# Patient Record
Sex: Male | Born: 1950 | Race: White | Hispanic: Refuse to answer | Marital: Married | Smoking: Never smoker
Health system: Southern US, Community
[De-identification: ages and names within clinical notes are randomized; demographics above are authoritative.]

## PROBLEM LIST (undated history)

## (undated) DIAGNOSIS — K769 Liver disease, unspecified: Secondary | ICD-10-CM

## (undated) DIAGNOSIS — D696 Thrombocytopenia, unspecified: Secondary | ICD-10-CM

## (undated) DIAGNOSIS — F419 Anxiety disorder, unspecified: Secondary | ICD-10-CM

---

## 2002-09-10 ENCOUNTER — Encounter: Payer: Self-pay | Admitting: Unknown Physician Specialty

## 2002-09-10 ENCOUNTER — Ambulatory Visit (HOSPITAL_COMMUNITY): Admission: RE | Admit: 2002-09-10 | Discharge: 2002-09-10 | Payer: Self-pay | Admitting: Unknown Physician Specialty

## 2009-01-03 ENCOUNTER — Ambulatory Visit: Payer: Self-pay | Admitting: Cardiology

## 2011-04-20 NOTE — H&P (Signed)
NTS SOAP Note  Vital Signs:  Vitals as of: 04/19/2011: Systolic 140: Diastolic 80: Heart Rate 60: Temp 97.67F: Height 26ft 1in: Weight 181Lbs 0 Ounces: Pain Level 0: BMI 24  BMI : 23.88 kg/m2  Subjective: This 61 Years 2 Months old Male presents for of  HERNIA: ,Has been having right groin pain for the past month.  Recently noted a lump in that region.  Pain radiates to the right testicle.  Made worse with straining.  Review of Symptoms:  Constitutional:unremarkable Head:unremarkable Eyes:unremarkable Nose/Mouth/Throat:unremarkable Cardiovascular:unremarkable Respiratory:unremarkable Gastrointestinal:unremarkable Genitourinary:unremarkable Musculoskeletal:unremarkable Skin:unremarkable Hematolgic/Lymphatic:unremarkable Allergic/Immunologic:unremarkable   Past Medical History:Reviewed   Past Medical History  Surgical History: none Medical Problems: none Allergies: nkda Medications: flonase, citalapram   Social History:Reviewed   Social History  Preferred Language: English ([place]) Race:  White Ethnicity: Not Hispanic / Latino Age: 61 Years 7 Months Alcohol:  Yes, one drink daily Recreational drug(s):  No   Smoking Status: Never smoker reviewed on 04/19/2011  Family History:Reviewed   Family History              Father:  Cancer    Objective Information: General:Well appearing, well nourished in no distress. Head:Atraumatic; no masses; no abnormalities Neck:Supple without lymphadenopathy.  Heart:RRR, no murmur or gallop.  Normal S1, S2.  No S3, S4.  Lungs:CTA bilaterally, no wheezes, rhonchi, rales.  Breathing unlabored. Abdomen:Soft, NT/ND, normal bowel sounds, no HSM, no masses.  No peritoneal signs.  Reducible right inguinal hernia noted. WG:[id number]  Assessment:Right inguinal hernia  Diagnosis &amp; Procedure: DiagnosisCode: 550.90, ProcedureCode: 46962,     Plan:  Will call to schedule right inguinal herniorrhaphy.   Patient Education:Alternative treatments to surgery were discussed with patient (and family).Risks and benefits  of procedure were fully explained to the patient (and family) who gave informed consent. Patient/family questions were addressed.  Follow-up:Pending Surgery

## 2011-04-26 ENCOUNTER — Encounter (HOSPITAL_COMMUNITY): Payer: Self-pay | Admitting: Pharmacy Technician

## 2011-05-03 ENCOUNTER — Other Ambulatory Visit: Payer: Self-pay

## 2011-05-03 ENCOUNTER — Encounter (HOSPITAL_COMMUNITY): Payer: Self-pay

## 2011-05-03 ENCOUNTER — Encounter (HOSPITAL_COMMUNITY)
Admission: RE | Admit: 2011-05-03 | Discharge: 2011-05-03 | Disposition: A | Discharge status: Home / Self Care | Payer: 59 | Source: Ambulatory Visit | Attending: General Surgery | Admitting: General Surgery

## 2011-05-03 HISTORY — DX: Gilbert syndrome: E80.4

## 2011-05-03 HISTORY — DX: Liver disease, unspecified: K76.9

## 2011-05-03 HISTORY — DX: Anxiety disorder, unspecified: F41.9

## 2011-05-03 HISTORY — DX: Thrombocytopenia, unspecified: D69.6

## 2011-05-03 LAB — SURGICAL PCR SCREEN: Staphylococcus aureus: POSITIVE — AB

## 2011-05-03 LAB — CBC
HCT: 47.5 % (ref 39.0–52.0)
MCV: 95.2 fL (ref 78.0–100.0)
Platelets: 135 10*3/uL — ABNORMAL LOW (ref 150–400)
RBC: 4.99 MIL/uL (ref 4.22–5.81)
WBC: 6.4 10*3/uL (ref 4.0–10.5)

## 2011-05-03 LAB — BASIC METABOLIC PANEL
CO2: 31 mEq/L (ref 19–32)
Calcium: 10.2 mg/dL (ref 8.4–10.5)
Chloride: 102 mEq/L (ref 96–112)
Sodium: 139 mEq/L (ref 135–145)

## 2011-05-03 LAB — DIFFERENTIAL
Eosinophils Relative: 4 % (ref 0–5)
Lymphocytes Relative: 32 % (ref 12–46)
Lymphs Abs: 2.1 10*3/uL (ref 0.7–4.0)
Neutro Abs: 3.6 10*3/uL (ref 1.7–7.7)

## 2011-05-03 NOTE — Patient Instructions (Addendum)
22 Keith Bailey  05/03/2011   Your procedure is scheduled on:  05/09/11  Report to [facility] at 06:15 AM.  Call this number if you have problems the morning of surgery: [phone]   Remember:   Do not eat food:After Midnight.  May have clear liquids:until Midnight .  Clear liquids include soda, tea, black coffee, apple or grape juice, broth.  Take these medicines the morning of surgery with A SIP OF WATER: Celexa. Also, use your inhalers Flonase and Astepro if needed and bring them with you to the hospital.   Do not wear jewelry, make-up or nail polish.  Do not wear lotions, powders, or perfumes. You may wear deodorant.  Do not shave 48 hours prior to surgery.  Do not bring valuables to the hospital.  Contacts, dentures or bridgework may not be worn into surgery.  Leave suitcase in the car. After surgery it may be brought to your room.  For patients admitted to the hospital, checkout time is 11:00 AM the day of discharge.   Patients discharged the day of surgery will not be allowed to drive home.  Name and phone number of your driver:   Special Instructions: CHG Shower Use Special Wash: 1/2 bottle night before surgery and 1/2 bottle morning of surgery.   Please read over the following fact sheets that you were given: Pain Booklet, MRSA Information, Surgical Site Infection Prevention, Anesthesia Post-op Instructions and Care and Recovery After Surgery    Inguinal Hernia, Adult Muscles help keep everything in the body in its proper place. But if a weak spot in the muscles develops, something can poke through. That is called a hernia. When this happens in the lower part of the belly (abdomen), it is called an inguinal hernia. (It takes its name from a part of the body in this region called the inguinal canal.) A weak spot in the wall of muscles lets some fat or part of the small intestine bulge through. An inguinal hernia can develop at any age. Men get them more often than women. CAUSES    In adults, an inguinal hernia develops over time.  It can be triggered by:   Suddenly straining the muscles of the lower abdomen.   Lifting heavy objects.   Straining to have a bowel movement. Difficult bowel movements (constipation) can lead to this.   Constant coughing. This may be caused by smoking or lung disease.   Being overweight.   Being pregnant.   Working at a job that requires long periods of standing or heavy lifting.   Having had an inguinal hernia before.  One type can be an emergency situation. It is called a strangulated inguinal hernia. It develops if part of the small intestine slips through the weak spot and cannot get back into the abdomen. The blood supply can be cut off. If that happens, part of the intestine may die. This situation requires emergency surgery. SYMPTOMS  Often, a small inguinal hernia has no symptoms. It is found when a healthcare provider does a physical exam. Larger hernias usually have symptoms.   In adults, symptoms may include:   A lump in the groin. This is easier to see when the person is standing. It might disappear when lying down.   In men, a lump in the scrotum.   Pain or burning in the groin. This occurs especially when lifting, straining or coughing.   A dull ache or feeling of pressure in the groin.   Signs of  a strangulated hernia can include:   A bulge in the groin that becomes very painful and tender to the touch.   A bulge that turns red or purple.   Fever, nausea and vomiting.   Inability to have a bowel movement or to pass gas.  DIAGNOSIS  To decide if you have an inguinal hernia, a healthcare provider will probably do a physical examination.  This will include asking questions about any symptoms you have noticed.   The healthcare provider might feel the groin area and ask you to cough. If an inguinal hernia is felt, the healthcare provider may try to slide it back into the abdomen.   Usually no other tests  are needed.  TREATMENT  Treatments can vary. The size of the hernia makes a difference. Options include:  Watchful waiting. This is often suggested if the hernia is small and you have had no symptoms.   No medical procedure will be done unless symptoms develop.   You will need to watch closely for symptoms. If any occur, contact your healthcare provider right away.   Surgery. This is used if the hernia is larger or you have symptoms.   Open surgery. This is usually an outpatient procedure (you will not stay overnight in a hospital). An cut (incision) is made through the skin in the groin. The hernia is put back inside the abdomen. The weak area in the muscles is then repaired by herniorrhaphy or hernioplasty. Herniorrhaphy: in this type of surgery, the weak muscles are sewn back together. Hernioplasty: a patch or mesh is used to close the weak area in the abdominal wall.   Laparoscopy. In this procedure, a surgeon makes small incisions. A thin tube with a tiny video camera (called a laparoscope) is put into the abdomen. The surgeon repairs the hernia with mesh by looking with the video camera and using two long instruments.  HOME CARE INSTRUCTIONS   After surgery to repair an inguinal hernia:   You will need to take pain medicine prescribed by your healthcare provider. Follow all directions carefully.   You will need to take care of the wound from the incision.   Your activity will be restricted for awhile. This will probably include no heavy lifting for several weeks. You also should not do anything too active for a few weeks. When you can return to work will depend on the type of job that you have.   During "watchful waiting" periods, you should:   Maintain a healthy weight.   Eat a diet high in fiber (fruits, vegetables and whole grains).   Drink plenty of fluids to avoid constipation. This means drinking enough water and other liquids to keep your urine clear or pale yellow.   Do  not lift heavy objects.   Do not stand for long periods of time.   Quit smoking. This should keep you from developing a frequent cough.  SEEK MEDICAL CARE IF:   A bulge develops in your groin area.   You feel pain, a burning sensation or pressure in the groin. This might be worse if you are lifting or straining.   You develop a fever of more than 100.5 F (38.1 C).  SEEK IMMEDIATE MEDICAL CARE IF:   Pain in the groin increases suddenly.   A bulge in the groin gets bigger suddenly and does not go down.   For men, there is sudden pain in the scrotum. Or, the size of the scrotum increases.   A  bulge in the groin area becomes red or purple and is painful to touch.   You have nausea or vomiting that does not go away.   You feel your heart beating much faster than normal.   You cannot have a bowel movement or pass gas.   You develop a fever of more than 102.0 F (38.9 C).  Document Released: 08/12/2008 Document Revised: 12/06/2010 Document Reviewed: 08/12/2008 [facility] Patient Information 2012 [place], [place].   PATIENT INSTRUCTIONS POST-ANESTHESIA  IMMEDIATELY FOLLOWING SURGERY:  Do not drive or operate machinery for the first twenty four hours after surgery.  Do not make any important decisions for twenty four hours after surgery or while taking narcotic pain medications or sedatives.  If you develop intractable nausea and vomiting or a severe headache please notify your doctor immediately.  FOLLOW-UP:  Please make an appointment with your surgeon as instructed. You do not need to follow up with anesthesia unless specifically instructed to do so.  WOUND CARE INSTRUCTIONS (if applicable):  Keep a dry clean dressing on the anesthesia/puncture wound site if there is drainage.  Once the wound has quit draining you may leave it open to air.  Generally you should leave the bandage intact for twenty four hours unless there is drainage.  If the epidural site drains for more than 36-48  hours please call the anesthesia department.  QUESTIONS?:  Please feel free to call your physician or the hospital operator if you have any questions, and they will be happy to assist you.     [facility] Anesthesia Department [address] [place] [phone]

## 2011-05-09 ENCOUNTER — Ambulatory Visit (HOSPITAL_COMMUNITY): Payer: 59 | Admitting: Anesthesiology

## 2011-05-09 ENCOUNTER — Encounter (HOSPITAL_COMMUNITY)
Admission: RE | Disposition: A | Discharge status: Home / Self Care | Payer: Self-pay | Source: Ambulatory Visit | Attending: General Surgery

## 2011-05-09 ENCOUNTER — Encounter (HOSPITAL_COMMUNITY): Payer: Self-pay | Admitting: Anesthesiology

## 2011-05-09 ENCOUNTER — Encounter (HOSPITAL_COMMUNITY): Payer: Self-pay | Admitting: *Deleted

## 2011-05-09 ENCOUNTER — Ambulatory Visit (HOSPITAL_COMMUNITY)
Admission: RE | Admit: 2011-05-09 | Discharge: 2011-05-09 | Disposition: A | Discharge status: Home / Self Care | Payer: 59 | Source: Ambulatory Visit | Attending: General Surgery | Admitting: General Surgery

## 2011-05-09 DIAGNOSIS — K4090 Unilateral inguinal hernia, without obstruction or gangrene, not specified as recurrent: Secondary | ICD-10-CM | POA: Insufficient documentation

## 2011-05-09 DIAGNOSIS — Z01812 Encounter for preprocedural laboratory examination: Secondary | ICD-10-CM | POA: Insufficient documentation

## 2011-05-09 DIAGNOSIS — Z01810 Encounter for preprocedural cardiovascular examination: Secondary | ICD-10-CM | POA: Insufficient documentation

## 2011-05-09 HISTORY — PX: INGUINAL HERNIA REPAIR: SHX194

## 2011-05-09 SURGERY — REPAIR, HERNIA, INGUINAL, ADULT
Anesthesia: General | Site: Groin | Laterality: Right | Wound class: Clean

## 2011-05-09 MED ORDER — BUPIVACAINE HCL (PF) 0.5 % IJ SOLN
INTRAMUSCULAR | Status: AC
  Filled 2011-05-09: qty 30

## 2011-05-09 MED ORDER — MIDAZOLAM HCL 2 MG/2ML IJ SOLN
1.0000 mg | INTRAMUSCULAR | Status: DC | PRN
  Administered 2011-05-09: 2 mg via INTRAVENOUS

## 2011-05-09 MED ORDER — ONDANSETRON HCL 4 MG/2ML IJ SOLN
4.0000 mg | Freq: Once | INTRAMUSCULAR | Status: AC
  Administered 2011-05-09: 4 mg via INTRAVENOUS

## 2011-05-09 MED ORDER — FENTANYL CITRATE 0.05 MG/ML IJ SOLN
INTRAMUSCULAR | Status: AC
  Administered 2011-05-09: 50 ug via INTRAVENOUS
  Filled 2011-05-09: qty 2

## 2011-05-09 MED ORDER — FENTANYL CITRATE 0.05 MG/ML IJ SOLN
INTRAMUSCULAR | Status: AC
  Filled 2011-05-09: qty 2

## 2011-05-09 MED ORDER — FENTANYL CITRATE 0.05 MG/ML IJ SOLN
INTRAMUSCULAR | Status: DC | PRN
  Administered 2011-05-09: 75 ug via INTRAVENOUS
  Administered 2011-05-09 (×2): 25 ug via INTRAVENOUS

## 2011-05-09 MED ORDER — HYDROCODONE-ACETAMINOPHEN 5-325 MG PO TABS: 1.0000 | ORAL_TABLET | ORAL | Status: AC | PRN

## 2011-05-09 MED ORDER — ENOXAPARIN SODIUM 40 MG/0.4ML ~~LOC~~ SOLN
40.0000 mg | Freq: Once | SUBCUTANEOUS | Status: AC
  Administered 2011-05-09: 40 mg via SUBCUTANEOUS

## 2011-05-09 MED ORDER — KETOROLAC TROMETHAMINE 30 MG/ML IJ SOLN
INTRAMUSCULAR | Status: AC
  Filled 2011-05-09: qty 1

## 2011-05-09 MED ORDER — LIDOCAINE HCL (PF) 1 % IJ SOLN
INTRAMUSCULAR | Status: AC
  Filled 2011-05-09: qty 5

## 2011-05-09 MED ORDER — LACTATED RINGERS IV SOLN
INTRAVENOUS | Status: DC
  Administered 2011-05-09: 1000 mL via INTRAVENOUS
  Administered 2011-05-09: 08:00:00 via INTRAVENOUS

## 2011-05-09 MED ORDER — LIDOCAINE HCL (CARDIAC) 10 MG/ML IV SOLN
INTRAVENOUS | Status: DC | PRN
  Administered 2011-05-09: 10 mg via INTRAVENOUS

## 2011-05-09 MED ORDER — SODIUM CHLORIDE 0.9 % IR SOLN
Status: DC | PRN
  Administered 2011-05-09: 1000 mL

## 2011-05-09 MED ORDER — BUPIVACAINE HCL (PF) 0.5 % IJ SOLN
INTRAMUSCULAR | Status: DC | PRN
  Administered 2011-05-09: 10 mL

## 2011-05-09 MED ORDER — GLYCOPYRROLATE 0.2 MG/ML IJ SOLN
INTRAMUSCULAR | Status: AC
  Filled 2011-05-09: qty 1

## 2011-05-09 MED ORDER — CEFAZOLIN SODIUM-DEXTROSE 2-3 GM-% IV SOLR
2.0000 g | INTRAVENOUS | Status: AC
  Administered 2011-05-09: 2 g via INTRAVENOUS

## 2011-05-09 MED ORDER — CEFAZOLIN SODIUM-DEXTROSE 2-3 GM-% IV SOLR
INTRAVENOUS | Status: AC
  Filled 2011-05-09: qty 50

## 2011-05-09 MED ORDER — FENTANYL CITRATE 0.05 MG/ML IJ SOLN
25.0000 ug | INTRAMUSCULAR | Status: DC | PRN
  Administered 2011-05-09: 50 ug via INTRAVENOUS

## 2011-05-09 MED ORDER — GLYCOPYRROLATE 0.2 MG/ML IJ SOLN
INTRAMUSCULAR | Status: DC | PRN
  Administered 2011-05-09: .4 mg via INTRAVENOUS

## 2011-05-09 MED ORDER — MIDAZOLAM HCL 2 MG/2ML IJ SOLN
INTRAMUSCULAR | Status: AC
  Administered 2011-05-09: 2 mg via INTRAVENOUS
  Filled 2011-05-09: qty 2

## 2011-05-09 MED ORDER — PROPOFOL 10 MG/ML IV EMUL
INTRAVENOUS | Status: DC | PRN
  Administered 2011-05-09: 150 mg via INTRAVENOUS

## 2011-05-09 MED ORDER — ONDANSETRON HCL 4 MG/2ML IJ SOLN: 4.0000 mg | Freq: Once | INTRAMUSCULAR | Status: DC | PRN

## 2011-05-09 MED ORDER — KETOROLAC TROMETHAMINE 30 MG/ML IJ SOLN
30.0000 mg | Freq: Once | INTRAMUSCULAR | Status: AC
  Administered 2011-05-09: 30 mg via INTRAVENOUS

## 2011-05-09 MED ORDER — PROPOFOL 10 MG/ML IV EMUL
INTRAVENOUS | Status: AC
  Filled 2011-05-09: qty 20

## 2011-05-09 MED ORDER — ONDANSETRON HCL 4 MG/2ML IJ SOLN
INTRAMUSCULAR | Status: AC
  Administered 2011-05-09: 4 mg via INTRAVENOUS
  Filled 2011-05-09: qty 2

## 2011-05-09 MED ORDER — ENOXAPARIN SODIUM 40 MG/0.4ML ~~LOC~~ SOLN
SUBCUTANEOUS | Status: AC
  Administered 2011-05-09: 40 mg via SUBCUTANEOUS
  Filled 2011-05-09: qty 0.4

## 2011-05-09 SURGICAL SUPPLY — 40 items
ADH SKN CLS APL DERMABOND .7 (GAUZE/BANDAGES/DRESSINGS) ×1
BAG HAMPER (MISCELLANEOUS) ×2 IMPLANT
CLOTH BEACON ORANGE TIMEOUT ST (SAFETY) ×2 IMPLANT
COVER LIGHT HANDLE STERIS (MISCELLANEOUS) ×4 IMPLANT
DECANTER SPIKE VIAL GLASS SM (MISCELLANEOUS) ×2 IMPLANT
DERMABOND ADVANCED (GAUZE/BANDAGES/DRESSINGS) ×1
DERMABOND ADVANCED .7 DNX12 (GAUZE/BANDAGES/DRESSINGS) ×1 IMPLANT
DRAIN PENROSE 18X.75 LTX STRL (MISCELLANEOUS) ×2 IMPLANT
ELECT REM PT RETURN 9FT ADLT (ELECTROSURGICAL) ×2
ELECTRODE REM PT RTRN 9FT ADLT (ELECTROSURGICAL) ×1 IMPLANT
FORMALIN 10 PREFIL 120ML (MISCELLANEOUS) IMPLANT
GLOVE BIO SURGEON STRL SZ7.5 (GLOVE) ×2 IMPLANT
GLOVE ECLIPSE 6.5 STRL STRAW (GLOVE) ×2 IMPLANT
GLOVE EXAM NITRILE MD LF STRL (GLOVE) ×2 IMPLANT
GLOVE INDICATOR 7.0 STRL GRN (GLOVE) ×1 IMPLANT
GOWN STRL REIN XL XLG (GOWN DISPOSABLE) ×6 IMPLANT
INST SET MINOR GENERAL (KITS) ×2 IMPLANT
KIT ROOM TURNOVER APOR (KITS) ×2 IMPLANT
MANIFOLD NEPTUNE II (INSTRUMENTS) ×2 IMPLANT
MESH MARLEX PLUG MEDIUM (Mesh General) ×1 IMPLANT
NDL HYPO 25X1 1.5 SAFETY (NEEDLE) ×1 IMPLANT
NEEDLE HYPO 25X1 1.5 SAFETY (NEEDLE) ×2 IMPLANT
NS IRRIG 1000ML POUR BTL (IV SOLUTION) ×2 IMPLANT
PACK MINOR (CUSTOM PROCEDURE TRAY) ×2 IMPLANT
PAD ARMBOARD 7.5X6 YLW CONV (MISCELLANEOUS) ×2 IMPLANT
PAIN PUMP ON-Q 100MLX2ML 2.5IN (PAIN MANAGEMENT) IMPLANT
SET BASIN LINEN APH (SET/KITS/TRAYS/PACK) ×2 IMPLANT
SOL PREP PROV IODINE SCRUB 4OZ (MISCELLANEOUS) ×2 IMPLANT
SUT NOVA NAB GS-22 2 2-0 T-19 (SUTURE) ×3 IMPLANT
SUT NOVAFIL NAB HGS22 2-0 30IN (SUTURE) ×1 IMPLANT
SUT SILK 3 0 (SUTURE) ×2
SUT SILK 3-0 18XBRD TIE 12 (SUTURE) ×1 IMPLANT
SUT VIC AB 2-0 CT1 27 (SUTURE) ×2
SUT VIC AB 2-0 CT1 TAPERPNT 27 (SUTURE) ×1 IMPLANT
SUT VIC AB 3-0 SH 27 (SUTURE) ×2
SUT VIC AB 3-0 SH 27X BRD (SUTURE) ×1 IMPLANT
SUT VIC AB 4-0 PS2 27 (SUTURE) ×2 IMPLANT
SUT VICRYL AB 3 0 TIES (SUTURE) IMPLANT
SYR CONTROL 10ML LL (SYRINGE) ×2 IMPLANT
TOWEL OR 17X26 4PK STRL BLUE (TOWEL DISPOSABLE) ×1 IMPLANT

## 2011-05-09 NOTE — Anesthesia Procedure Notes (Signed)
Procedure Name: LMA Insertion Date/Time: 05/09/2011 7:42 AM Performed by: Minerva Areola Pre-anesthesia Checklist: Patient identified, Patient being monitored, Emergency Drugs available, Timeout performed and Suction available Patient Re-evaluated:Patient Re-evaluated prior to inductionOxygen Delivery Method: Circle System Utilized Preoxygenation: Pre-oxygenation with 100% oxygen Intubation Type: IV induction Ventilation: Mask ventilation without difficulty LMA: LMA inserted LMA Size: 4.0 Number of attempts: 1 Placement Confirmation: positive ETCO2 and breath sounds checked- equal and bilateral

## 2011-05-09 NOTE — Interval H&P Note (Signed)
History and Physical Interval Note:  05/09/2011 7:29 AM  Keith Bailey  has presented today for surgery, with the diagnosis of Right Inguinal Hernia  The various methods of treatment have been discussed with the patient and family. After consideration of risks, benefits and other options for treatment, the patient has consented to  Procedure(s): HERNIA REPAIR INGUINAL ADULT as a surgical intervention .  The patients' history has been reviewed, patient examined, no change in status, stable for surgery.  I have reviewed the patients' chart and labs.  Questions were answered to the patient's satisfaction.     Franky Macho A

## 2011-05-09 NOTE — Anesthesia Preprocedure Evaluation (Addendum)
Anesthesia Evaluation  Patient identified by MRN, date of birth, ID band Patient awake    Reviewed: Allergy & Precautions, H&P , NPO status , Patient's Chart, lab work & pertinent test results  Airway Mallampati: I      Dental  (+) Teeth Intact   Pulmonary neg pulmonary ROS,  clear to auscultation        Cardiovascular neg cardio ROS Regular Normal    Neuro/Psych Anxiety    GI/Hepatic negative GI ROS,   Endo/Other    Renal/GU      Musculoskeletal   Abdominal   Peds  Hematology   Anesthesia Other Findings   Reproductive/Obstetrics                           Anesthesia Physical Anesthesia Plan  ASA: II  Anesthesia Plan: General   Post-op Pain Management:    Induction: Intravenous  Airway Management Planned: LMA  Additional Equipment:   Intra-op Plan:   Post-operative Plan: Extubation in OR  Informed Consent: I have reviewed the patients History and Physical, chart, labs and discussed the procedure including the risks, benefits and alternatives for the proposed anesthesia with the patient or authorized representative who has indicated his/her understanding and acceptance.     Plan Discussed with:   Anesthesia Plan Comments:         Anesthesia Quick Evaluation

## 2011-05-09 NOTE — Progress Notes (Signed)
Awake. Rates pain 2. Refuses pain med at this time.

## 2011-05-09 NOTE — Transfer of Care (Signed)
Immediate Anesthesia Transfer of Care Note  Patient: Keith Bailey  Procedure(s) Performed:  HERNIA REPAIR INGUINAL ADULT  Patient Location: PACU  Anesthesia Type: General  Level of Consciousness: awake  Airway & Oxygen Therapy: Patient Spontanous Breathing and non-rebreather face mask  Post-op Assessment: Report given to PACU RN, Post -op Vital signs reviewed and stable and Patient moving all extremities  Post vital signs: Reviewed and stable  Complications: No apparent anesthesia complications

## 2011-05-09 NOTE — Op Note (Signed)
Patient:  Keith Bailey  DOB:  05/04/1950  MRN:  [id number]   Preop Diagnosis:  Right inguinal hernia  Postop Diagnosis:  Same, indirect  Procedure:  Right inguinal herniorrhaphy  Surgeon:  Franky Macho, M.D.  Anes: General  Indications:  Patient is a 61 year old white male who presents with a symptomatic right inguinal hernia. The risks and benefits of the procedure including bleeding, infection, and recurrence of the hernia were fully explained to the patient, gave informed consent.  Procedure note:  Patient was placed in the supine position. After general anesthesia was administered, the right groin region was prepped and draped using the usual sterile technique with Betadine. Surgical site confirmation was performed.  A right groin incision was made down to the external oblique a Penrose use. A Penrose this was incised to the external ring. A Penrose drain was placed around the spermatic cord. The vas deferens was noted in the spermatic cord. The ilioinguinal nerve was identified and monitored throughout the procedure. A lipoma of the cord was found in a high ligation was performed using a 3-0 silk suture. The specimen was disposed of. An indirect hernia sac was found. This was freed away from the spermatic cord up to the peritoneal reflection and inverted. A medium size Bard mesh PerFix plug was then inserted and secured to the transversalis fascia using a 2-0 Novafil interrupted suture. An onlay patch was then placed along the floor of inguinal canal and secured superiorly to the conjoined tendon and inferiorly to the shelving edge of Poupart's ligament using 2-0 Novafil interrupted sutures. The internal ring was recreated using a 2-0 Novafil interrupted suture. The external oblique aponeurosis was reapproximated using a 2-0 Vicryl running suture. Subcutaneous layer was reapproximated using 3-0 Vicryl interrupted suture. The skin was closed a 4-0 Vicryl subcuticular suture. 0.5% Sensorcaine  was instilled the surrounding wound. Dermabond was then applied.  All tape and needle counts were correct the end of the procedure. Patient was awakened transferred to PACU in stable condition.  Complications:  None  EBL:  Minimal  Specimen:  None

## 2011-05-09 NOTE — Anesthesia Postprocedure Evaluation (Signed)
Anesthesia Post Note  Patient: Keith Bailey  Procedure(s) Performed:  HERNIA REPAIR INGUINAL ADULT  Anesthesia type: General  Patient location: PACU  Post pain: Pain level controlled  Post assessment: Post-op Vital signs reviewed, Patient's Cardiovascular Status Stable, Respiratory Function Stable, Patent Airway, No signs of Nausea or vomiting and Pain level controlled  Last Vitals:  Filed Vitals:   05/09/11 0840  BP: 128/72  Pulse: 80  Temp: 37 C  Resp: 23    Post vital signs: Reviewed and stable  Level of consciousness: awake and alert   Complications: No apparent anesthesia complications

## 2011-05-11 ENCOUNTER — Encounter (HOSPITAL_COMMUNITY): Payer: Self-pay | Admitting: General Surgery

## 2014-02-01 NOTE — H&P (Signed)
  NTS SOAP Note  Vital Signs:  Vitals as of: 02/01/2014: Systolic 129: Diastolic 82: Heart Rate 57: Temp 98.46F: Height 396ft 1in: Weight 166Lbs 0 Ounces: BMI 21.9  BMI : 21.9 kg/m2  Subjective: This 63 Years old Male presents for of a left inguinal hernia.  Has been present for a few months.  Had an episode of pain and swelling in the left groin, subsided after he pushed it back in.  Review of Symptoms:  Constitutional:unremarkable   Head:unremarkable    Eyes:unremarkable   Nose/Mouth/Throat:unremarkable Cardiovascular:  unremarkable   Respiratory:unremarkable   Gastrointestinal:  unremarkable   Genitourinary:unremarkable       back pain Skin:unremarkable Hematolgic/Lymphatic:unremarkable     Allergic/Immunologic:unremarkable     Past Medical History:    Reviewed  Past Medical History  Surgical History: [facility] 2013 Medical Problems: none Allergies: nkda Medications: flonase, citalapram   Social History:Reviewed  Social History  Preferred Language: English ([place]) Race:  White Ethnicity: Not Hispanic / Latino Age: [age] Years 7 Months Alcohol:  Yes, one drink daily Recreational drug(s):  No   Smoking Status: Never smoker reviewed on 11/19/2013 Functional Status reviewed on 11/19/2013 ------------------------------------------------ Bathing: Normal Cooking: Normal Dressing: Normal Driving: Normal Eating: Normal Managing Meds: Normal Oral Care: Normal Shopping: Normal Toileting: Normal Transferring: Normal Walking: Normal Cognitive Status reviewed on 11/19/2013 ------------------------------------------------ Attention: Normal Decision Making: Normal Language: Normal Memory: Normal Motor: Normal Perception: Normal Problem Solving: Normal Visual and Spatial: Normal   Family History:  Reviewed  Family Health History Family History is Unknown    Objective Information: General:  Well appearing,  well nourished in no distress. Heart:  RRR, no murmur Lungs:    CTA bilaterally, no wheezes, rhonchi, rales.  Breathing unlabored. Abdomen:Soft, NT/ND, normal bowel sounds, no HSM, no masses.  No peritoneal signs.  Reducible left inguinal hernia. ZO:[id number]:unremarkable    Assessment:Left inguinal hernia  Diagnoses: 550.90 Unilateral or unspecified inguinal hernia, without mention of obstruction or gangrene (not specified as recurrent)  Procedures: 99214 - OFFICE OUTPATIENT VISIT 25 MINUTES    Plan:  Will call to schedule left inguinal herniorrhaphy with mesh on 02/17/14.   Patient Education:Alternative treatments to surgery were discussed with patient (and family).  Risks and benefits  of procedure including bleeding, infection, and recurrence were fully explained to the patient (and family) who gave informed consent. Patient/family questions were addressed.  Follow-up:Pending Surgery

## 2014-02-05 ENCOUNTER — Encounter (HOSPITAL_COMMUNITY): Payer: Self-pay | Admitting: Pharmacy Technician

## 2014-02-11 ENCOUNTER — Encounter (HOSPITAL_COMMUNITY): Payer: Self-pay

## 2014-02-11 ENCOUNTER — Other Ambulatory Visit: Payer: Self-pay

## 2014-02-11 ENCOUNTER — Encounter (HOSPITAL_COMMUNITY)
Admission: RE | Admit: 2014-02-11 | Discharge: 2014-02-11 | Disposition: A | Discharge status: Home / Self Care | Payer: 59 | Source: Ambulatory Visit | Attending: General Surgery | Admitting: General Surgery

## 2014-02-11 DIAGNOSIS — Z01818 Encounter for other preprocedural examination: Secondary | ICD-10-CM | POA: Insufficient documentation

## 2014-02-11 LAB — CBC WITH DIFFERENTIAL/PLATELET
BASOS PCT: 0 % (ref 0–1)
Basophils Absolute: 0 10*3/uL (ref 0.0–0.1)
Eosinophils Absolute: 0.3 10*3/uL (ref 0.0–0.7)
Eosinophils Relative: 4 % (ref 0–5)
HEMATOCRIT: 44.9 % (ref 39.0–52.0)
HEMOGLOBIN: 15.4 g/dL (ref 13.0–17.0)
Lymphocytes Relative: 29 % (ref 12–46)
Lymphs Abs: 2.2 10*3/uL (ref 0.7–4.0)
MCH: 32.5 pg (ref 26.0–34.0)
MCHC: 34.3 g/dL (ref 30.0–36.0)
MCV: 94.7 fL (ref 78.0–100.0)
MONO ABS: 0.5 10*3/uL (ref 0.1–1.0)
Monocytes Relative: 7 % (ref 3–12)
NEUTROS ABS: 4.7 10*3/uL (ref 1.7–7.7)
Neutrophils Relative %: 60 % (ref 43–77)
Platelets: 159 10*3/uL (ref 150–400)
RBC: 4.74 MIL/uL (ref 4.22–5.81)
RDW: 12.2 % (ref 11.5–15.5)
WBC: 7.7 10*3/uL (ref 4.0–10.5)

## 2014-02-11 LAB — BASIC METABOLIC PANEL
Anion gap: 10 (ref 5–15)
BUN: 15 mg/dL (ref 6–23)
CO2: 26 meq/L (ref 19–32)
CREATININE: 0.82 mg/dL (ref 0.50–1.35)
Calcium: 9.5 mg/dL (ref 8.4–10.5)
Chloride: 104 mEq/L (ref 96–112)
GFR calc Af Amer: >90 mL/min (ref >90)
GFR calc non Af Amer: >90 mL/min (ref >90)
Glucose, Bld: 94 mg/dL (ref 70–99)
Potassium: 5 mEq/L (ref 3.7–5.3)
Sodium: 140 mEq/L (ref 137–147)

## 2014-02-11 NOTE — Pre-Procedure Instructions (Signed)
Patient given information to sign up for my chart at home. 

## 2014-02-11 NOTE — Patient Instructions (Signed)
Keith Bailey  02/11/2014   Your procedure is scheduled on:   02/17/14  Report to [facility] at  700  AM.  Call this number if you have problems the morning of surgery: [phone]   Remember:   Do not eat food or drink liquids after midnight.   Take these medicines the morning of surgery with A SIP OF WATER: celexa   Do not wear jewelry, make-up or nail polish.  Do not wear lotions, powders, or perfumes.   Do not shave 48 hours prior to surgery. Men may shave face and neck.  Do not bring valuables to the hospital.  [facility] is not responsible for any belongings or valuables.               Contacts, dentures or bridgework may not be worn into surgery.  Leave suitcase in the car. After surgery it may be brought to your room.  For patients admitted to the hospital, discharge time is determined by your treatment team.               Patients discharged the day of surgery will not be allowed to drive home.  Name and phone number of your driver: family  Special Instructions: Shower using CHG 2 nights before surgery and the night before surgery.  If you shower the day of surgery use CHG.  Use special wash - you have one bottle of CHG for all showers.  You should use approximately 1/3 of the bottle for each shower.   Please read over the following fact sheets that you were given: Pain Booklet, Coughing and Deep Breathing, Surgical Site Infection Prevention, Anesthesia Post-op Instructions and Care and Recovery After Surgery Hernia A hernia occurs when an internal organ pushes out through a weak spot in the abdominal wall. Hernias most commonly occur in the groin and around the navel. Hernias often can be pushed back into place (reduced). Most hernias tend to get worse over time. Some abdominal hernias can get stuck in the opening (irreducible or incarcerated hernia) and cannot be reduced. An irreducible abdominal hernia which is tightly squeezed into the opening is at risk for  impaired blood supply (strangulated hernia). A strangulated hernia is a medical emergency. Because of the risk for an irreducible or strangulated hernia, surgery may be recommended to repair a hernia. CAUSES   Heavy lifting.  Prolonged coughing.  Straining to have a bowel movement.  A cut (incision) made during an abdominal surgery. HOME CARE INSTRUCTIONS   Bed rest is not required. You may continue your normal activities.  Avoid lifting more than 10 pounds (4.5 kg) or straining.  Cough gently. If you are a smoker it is best to stop. Even the best hernia repair can break down with the continual strain of coughing. Even if you do not have your hernia repaired, a cough will continue to aggravate the problem.  Do not wear anything tight over your hernia. Do not try to keep it in with an outside bandage or truss. These can damage abdominal contents if they are trapped within the hernia sac.  Eat a normal diet.  Avoid constipation. Straining over long periods of time will increase hernia size and encourage breakdown of repairs. If you cannot do this with diet alone, stool softeners may be used. SEEK IMMEDIATE MEDICAL CARE IF:   You have a fever.  You develop increasing abdominal pain.  You feel nauseous or vomit.  Your hernia is  stuck outside the abdomen, looks discolored, feels hard, or is tender.  You have any changes in your bowel habits or in the hernia that are unusual for you.  You have increased pain or swelling around the hernia.  You cannot push the hernia back in place by applying gentle pressure while lying down. MAKE SURE YOU:   Understand these instructions.  Will watch your condition.  Will get help right away if you are not doing well or get worse. Document Released: 03/26/2005 Document Revised: 06/18/2011 Document Reviewed: 11/13/2007 [facility] Patient Information 2015 [place], [place]. This information is not intended to replace advice given to you by your  health care provider. Make sure you discuss any questions you have with your health care provider. PATIENT INSTRUCTIONS POST-ANESTHESIA  IMMEDIATELY FOLLOWING SURGERY:  Do not drive or operate machinery for the first twenty four hours after surgery.  Do not make any important decisions for twenty four hours after surgery or while taking narcotic pain medications or sedatives.  If you develop intractable nausea and vomiting or a severe headache please notify your doctor immediately.  FOLLOW-UP:  Please make an appointment with your surgeon as instructed. You do not need to follow up with anesthesia unless specifically instructed to do so.  WOUND CARE INSTRUCTIONS (if applicable):  Keep a dry clean dressing on the anesthesia/puncture wound site if there is drainage.  Once the wound has quit draining you may leave it open to air.  Generally you should leave the bandage intact for twenty four hours unless there is drainage.  If the epidural site drains for more than 36-48 hours please call the anesthesia department.  QUESTIONS?:  Please feel free to call your physician or the hospital operator if you have any questions, and they will be happy to assist you.

## 2014-02-17 ENCOUNTER — Ambulatory Visit (HOSPITAL_COMMUNITY): Payer: 59 | Admitting: Anesthesiology

## 2014-02-17 ENCOUNTER — Encounter (HOSPITAL_COMMUNITY): Payer: Self-pay | Admitting: *Deleted

## 2014-02-17 ENCOUNTER — Ambulatory Visit (HOSPITAL_COMMUNITY)
Admission: RE | Admit: 2014-02-17 | Discharge: 2014-02-17 | Disposition: A | Discharge status: Home / Self Care | Payer: 59 | Source: Ambulatory Visit | Attending: General Surgery | Admitting: General Surgery

## 2014-02-17 ENCOUNTER — Encounter (HOSPITAL_COMMUNITY)
Admission: RE | Disposition: A | Discharge status: Home / Self Care | Payer: Self-pay | Source: Ambulatory Visit | Attending: General Surgery

## 2014-02-17 DIAGNOSIS — F419 Anxiety disorder, unspecified: Secondary | ICD-10-CM | POA: Insufficient documentation

## 2014-02-17 DIAGNOSIS — K4090 Unilateral inguinal hernia, without obstruction or gangrene, not specified as recurrent: Secondary | ICD-10-CM | POA: Insufficient documentation

## 2014-02-17 HISTORY — PX: INSERTION OF MESH: SHX5868

## 2014-02-17 HISTORY — PX: INGUINAL HERNIA REPAIR: SHX194

## 2014-02-17 SURGERY — REPAIR, HERNIA, INGUINAL, ADULT
Anesthesia: General | Site: Groin | Laterality: Left

## 2014-02-17 MED ORDER — SODIUM CHLORIDE 0.9 % IJ SOLN
INTRAMUSCULAR | Status: AC
  Filled 2014-02-17: qty 10

## 2014-02-17 MED ORDER — BUPIVACAINE LIPOSOME 1.3 % IJ SUSP
INTRAMUSCULAR | Status: AC
  Filled 2014-02-17: qty 20

## 2014-02-17 MED ORDER — SODIUM CHLORIDE 0.9 % IR SOLN
Status: DC | PRN
  Administered 2014-02-17: 1000 mL

## 2014-02-17 MED ORDER — SUCCINYLCHOLINE CHLORIDE 20 MG/ML IJ SOLN
INTRAMUSCULAR | Status: AC
  Filled 2014-02-17: qty 1

## 2014-02-17 MED ORDER — MIDAZOLAM HCL 2 MG/2ML IJ SOLN
INTRAMUSCULAR | Status: AC
  Filled 2014-02-17: qty 2

## 2014-02-17 MED ORDER — GLYCOPYRROLATE 0.2 MG/ML IJ SOLN
INTRAMUSCULAR | Status: DC | PRN
  Administered 2014-02-17: 0.2 mg via INTRAVENOUS

## 2014-02-17 MED ORDER — FENTANYL CITRATE 0.05 MG/ML IJ SOLN
INTRAMUSCULAR | Status: AC
  Filled 2014-02-17: qty 2

## 2014-02-17 MED ORDER — FENTANYL CITRATE 0.05 MG/ML IJ SOLN
25.0000 ug | INTRAMUSCULAR | Status: DC | PRN
Start: 2014-02-17 — End: 2014-02-17
  Administered 2014-02-17 (×2): 50 ug via INTRAVENOUS

## 2014-02-17 MED ORDER — FENTANYL CITRATE 0.05 MG/ML IJ SOLN
INTRAMUSCULAR | Status: AC
  Filled 2014-02-17: qty 5

## 2014-02-17 MED ORDER — LIDOCAINE HCL (PF) 1 % IJ SOLN
INTRAMUSCULAR | Status: AC
  Filled 2014-02-17: qty 5

## 2014-02-17 MED ORDER — PROPOFOL 10 MG/ML IV BOLUS
INTRAVENOUS | Status: AC
  Filled 2014-02-17: qty 20

## 2014-02-17 MED ORDER — CEFAZOLIN SODIUM-DEXTROSE 2-3 GM-% IV SOLR
2.0000 g | INTRAVENOUS | Status: AC
  Administered 2014-02-17: 2 g via INTRAVENOUS
  Filled 2014-02-17: qty 50

## 2014-02-17 MED ORDER — BUPIVACAINE HCL (PF) 0.5 % IJ SOLN
INTRAMUSCULAR | Status: AC
  Filled 2014-02-17: qty 30

## 2014-02-17 MED ORDER — LACTATED RINGERS IV SOLN
INTRAVENOUS | Status: DC
  Administered 2014-02-17: 1000 mL via INTRAVENOUS

## 2014-02-17 MED ORDER — EPHEDRINE SULFATE 50 MG/ML IJ SOLN
INTRAMUSCULAR | Status: DC | PRN
  Administered 2014-02-17: 10 mg via INTRAVENOUS

## 2014-02-17 MED ORDER — ONDANSETRON HCL 4 MG/2ML IJ SOLN: 4.0000 mg | Freq: Once | INTRAMUSCULAR | Status: DC | PRN

## 2014-02-17 MED ORDER — MIDAZOLAM HCL 2 MG/2ML IJ SOLN
1.0000 mg | INTRAMUSCULAR | Status: DC | PRN
  Administered 2014-02-17: 2 mg via INTRAVENOUS

## 2014-02-17 MED ORDER — FENTANYL CITRATE 0.05 MG/ML IJ SOLN
INTRAMUSCULAR | Status: DC | PRN
  Administered 2014-02-17 (×5): 50 ug via INTRAVENOUS

## 2014-02-17 MED ORDER — EPHEDRINE SULFATE 50 MG/ML IJ SOLN
INTRAMUSCULAR | Status: AC
  Filled 2014-02-17: qty 1

## 2014-02-17 MED ORDER — BUPIVACAINE LIPOSOME 1.3 % IJ SUSP
INTRAMUSCULAR | Status: DC | PRN
  Administered 2014-02-17: 20 mL

## 2014-02-17 MED ORDER — LACTATED RINGERS IV SOLN
INTRAVENOUS | Status: DC | PRN
  Administered 2014-02-17 (×2): via INTRAVENOUS

## 2014-02-17 MED ORDER — FENTANYL CITRATE 0.05 MG/ML IJ SOLN
25.0000 ug | INTRAMUSCULAR | Status: AC
  Administered 2014-02-17 (×2): 25 ug via INTRAVENOUS

## 2014-02-17 MED ORDER — KETOROLAC TROMETHAMINE 30 MG/ML IJ SOLN
INTRAMUSCULAR | Status: AC
  Filled 2014-02-17: qty 1

## 2014-02-17 MED ORDER — POVIDONE-IODINE 10 % EX OINT
TOPICAL_OINTMENT | CUTANEOUS | Status: AC
  Filled 2014-02-17: qty 1

## 2014-02-17 MED ORDER — ONDANSETRON HCL 4 MG/2ML IJ SOLN
4.0000 mg | Freq: Once | INTRAMUSCULAR | Status: AC
  Administered 2014-02-17: 4 mg via INTRAVENOUS

## 2014-02-17 MED ORDER — CHLORHEXIDINE GLUCONATE 4 % EX LIQD: 1.0000 "application " | Freq: Once | CUTANEOUS | Status: DC

## 2014-02-17 MED ORDER — ONDANSETRON HCL 4 MG/2ML IJ SOLN
INTRAMUSCULAR | Status: AC
  Filled 2014-02-17: qty 2

## 2014-02-17 MED ORDER — OXYCODONE-ACETAMINOPHEN 7.5-325 MG PO TABS: 1.0000 | ORAL_TABLET | ORAL | Status: DC | PRN

## 2014-02-17 MED ORDER — LIDOCAINE HCL (CARDIAC) 10 MG/ML IV SOLN
INTRAVENOUS | Status: DC | PRN
  Administered 2014-02-17: 50 mg via INTRAVENOUS

## 2014-02-17 MED ORDER — KETOROLAC TROMETHAMINE 30 MG/ML IJ SOLN
30.0000 mg | Freq: Once | INTRAMUSCULAR | Status: AC
  Administered 2014-02-17: 30 mg via INTRAVENOUS

## 2014-02-17 MED ORDER — PROPOFOL 10 MG/ML IV BOLUS
INTRAVENOUS | Status: DC | PRN
Start: 2014-02-17 — End: 2014-02-17
  Administered 2014-02-17: 150 mg via INTRAVENOUS

## 2014-02-17 SURGICAL SUPPLY — 37 items
ADH SKN CLS APL DERMABOND .7 (GAUZE/BANDAGES/DRESSINGS) ×1
BAG HAMPER (MISCELLANEOUS) ×2 IMPLANT
BLADE SURG 15 STRL LF DISP TIS (BLADE) IMPLANT
BLADE SURG 15 STRL SS (BLADE) ×2
CLOTH BEACON ORANGE TIMEOUT ST (SAFETY) ×2 IMPLANT
COVER LIGHT HANDLE STERIS (MISCELLANEOUS) ×4 IMPLANT
DECANTER SPIKE VIAL GLASS SM (MISCELLANEOUS) ×2 IMPLANT
DERMABOND ADVANCED (GAUZE/BANDAGES/DRESSINGS) ×1
DERMABOND ADVANCED .7 DNX12 (GAUZE/BANDAGES/DRESSINGS) ×1 IMPLANT
DRAIN PENROSE 18X1/2 LTX STRL (DRAIN) ×2 IMPLANT
ELECT REM PT RETURN 9FT ADLT (ELECTROSURGICAL) ×2
ELECTRODE REM PT RTRN 9FT ADLT (ELECTROSURGICAL) ×1 IMPLANT
GLOVE BIOGEL PI IND STRL 7.5 (GLOVE) IMPLANT
GLOVE BIOGEL PI INDICATOR 7.5 (GLOVE) ×2
GLOVE EXAM NITRILE LRG STRL (GLOVE) ×1 IMPLANT
GLOVE SURG SS PI 7.5 STRL IVOR (GLOVE) ×3 IMPLANT
GOWN STRL REUS W/ TWL XL LVL3 (GOWN DISPOSABLE) ×1 IMPLANT
GOWN STRL REUS W/TWL LRG LVL3 (GOWN DISPOSABLE) ×4 IMPLANT
GOWN STRL REUS W/TWL XL LVL3 (GOWN DISPOSABLE) ×2
INST SET MINOR GENERAL (KITS) ×2 IMPLANT
KIT ROOM TURNOVER APOR (KITS) ×2 IMPLANT
MANIFOLD NEPTUNE II (INSTRUMENTS) ×2 IMPLANT
MESH MARLEX PLUG MEDIUM (Mesh General) ×1 IMPLANT
NDL HYPO 21X1.5 SAFETY (NEEDLE) ×1 IMPLANT
NEEDLE HYPO 21X1.5 SAFETY (NEEDLE) ×2 IMPLANT
NS IRRIG 1000ML POUR BTL (IV SOLUTION) ×2 IMPLANT
PACK MINOR (CUSTOM PROCEDURE TRAY) ×2 IMPLANT
PAD ARMBOARD 7.5X6 YLW CONV (MISCELLANEOUS) ×2 IMPLANT
SCRUB PCMX 4 OZ (MISCELLANEOUS) ×2 IMPLANT
SET BASIN LINEN APH (SET/KITS/TRAYS/PACK) ×2 IMPLANT
SUT NOVA NAB GS-22 2 2-0 T-19 (SUTURE) ×3 IMPLANT
SUT VIC AB 2-0 CT1 27 (SUTURE) ×2
SUT VIC AB 2-0 CT1 TAPERPNT 27 (SUTURE) ×1 IMPLANT
SUT VIC AB 3-0 SH 27 (SUTURE) ×2
SUT VIC AB 3-0 SH 27X BRD (SUTURE) ×1 IMPLANT
SUT VIC AB 4-0 PS2 27 (SUTURE) ×2 IMPLANT
SYR 20CC LL (SYRINGE) ×2 IMPLANT

## 2014-02-17 NOTE — Anesthesia Postprocedure Evaluation (Signed)
  Anesthesia Post-op Note  Patient: Keith RoanJames C Kocurek  Procedure(s) Performed: Procedure(s): HERNIA REPAIR INGUINAL ADULT WITH MESH (Left) INSERTION OF MESH (Left)  Patient Location: PACU  Anesthesia Type:General  Level of Consciousness: awake, alert , oriented and patient cooperative  Airway and Oxygen Therapy: Patient Spontanous Breathing  Post-op Pain: mild  Post-op Assessment: Post-op Vital signs reviewed, Patient's Cardiovascular Status Stable, Respiratory Function Stable, Patent Airway, No signs of Nausea or vomiting and Pain level controlled  Post-op Vital Signs: Reviewed and stable  Last Vitals:  Filed Vitals:   02/17/14 0933  BP: 130/72  Pulse: 63  Temp: 36.8 C  Resp: 18    Complications: No apparent anesthesia complications

## 2014-02-17 NOTE — Interval H&P Note (Signed)
History and Physical Interval Note:  02/17/2014 8:06 AM  Keith RoanJames C Diantonio  has presented today for surgery, with the diagnosis of left inguinal hernia  The various methods of treatment have been discussed with the patient and family. After consideration of risks, benefits and other options for treatment, the patient has consented to  Procedure(s): HERNIA REPAIR INGUINAL ADULT WITH MESH (Left) as a surgical intervention .  The patient's history has been reviewed, patient examined, no change in status, stable for surgery.  I have reviewed the patient's chart and labs.  Questions were answered to the patient's satisfaction.     Franky MachoJENKINS,Dorita Rowlands A

## 2014-02-17 NOTE — Anesthesia Preprocedure Evaluation (Signed)
Anesthesia Evaluation  Patient identified by MRN, date of birth, ID band Patient awake    Reviewed: Allergy & Precautions, H&P , NPO status , Patient's Chart, lab work & pertinent test results  Airway Mallampati: I       Dental  (+) Teeth Intact   Pulmonary neg pulmonary ROS,  breath sounds clear to auscultation        Cardiovascular negative cardio ROS  Rhythm:Regular Rate:Normal     Neuro/Psych Anxiety    GI/Hepatic negative GI ROS, Gilbert's Disease   Endo/Other    Renal/GU      Musculoskeletal   Abdominal   Peds  Hematology   Anesthesia Other Findings   Reproductive/Obstetrics                             Anesthesia Physical Anesthesia Plan  ASA: II  Anesthesia Plan: General   Post-op Pain Management:    Induction: Intravenous  Airway Management Planned: LMA  Additional Equipment:   Intra-op Plan:   Post-operative Plan: Extubation in OR  Informed Consent: I have reviewed the patients History and Physical, chart, labs and discussed the procedure including the risks, benefits and alternatives for the proposed anesthesia with the patient or authorized representative who has indicated his/her understanding and acceptance.     Plan Discussed with:   Anesthesia Plan Comments:         Anesthesia Quick Evaluation

## 2014-02-17 NOTE — Op Note (Signed)
Patient:  Keith Bailey  DOB:  26-Aug-1950  MRN:  [id number]   Preop Diagnosis:  Left inguinal hernia  Postop Diagnosis:  same  Procedure:  Left inguinal herniorrhaphy with mesh  Surgeon:  Franky MachoMark Annajulia Lewing, M.D.  Anes:  general  Indications:  Patient is a 63 year old white male who presents with a symptomatic left internal hernia. Risks and benefits of the procedure including bleeding, infection, mesh use, and the possibility of recurrence of the hernia were fully explained to the patient, who gave informed consent.  Procedure note:  The patient is placed the supine position. After general anesthesia was induced, the left groin region was prepped and draped using the usual sterile technique with CBG. Surgical site confirmation was performed.  A left groin incision was made down to the external oblique aponeuroses. The aponeuroses was incised to the external ring. A Penrose drain was placed around the spermatic cord. The vas deferens was noted within the spermatic cord. The ilioinguinal nerve was identified retracted superiorly from the operative field. An indirect hernia sac was found. This was freed away from the spermatic cord up to the peritoneal reflection and inverted. A medium size Bard PerFix plug was then inserted. An onlay patch was then placed along the floor of the inguinal canal and secured superiorly to the conjoined tendon and inferiorly to the shelving edge of Poupart's ligament using 2-0 Novafil interrupted sutures. The internal ring was re-created using a 2-0 Novafil interrupted suture. The external oblique aponeuroses was reapproximated using a 2-0 Vicryl running suture. The subcutaneous layer was reapproximated using a 3-0 Vicryl interrupted suture. The skin was closed using a 4-0 Vicryl subcuticular suture.  Exparel was instilled into the surrounding wound. Dermabond was then applied.  All tape and needle counts were correct at the end of the procedure. Patient was awakened and  transferred to PACU in stable condition.  Complications:  none  EBL:  minimal  Specimen:  none

## 2014-02-17 NOTE — Transfer of Care (Signed)
Immediate Anesthesia Transfer of Care Note  Patient: Keith RoanJames C Scheffel  Procedure(s) Performed: Procedure(s): HERNIA REPAIR INGUINAL ADULT WITH MESH (Left) INSERTION OF MESH (Left)  Patient Location: PACU  Anesthesia Type:MAC  Level of Consciousness: awake, alert , oriented and patient cooperative  Airway & Oxygen Therapy: Patient Spontanous Breathing and Patient connected to face mask oxygen  Post-op Assessment: Report given to PACU RN and Post -op Vital signs reviewed and stable  Post vital signs: Reviewed and stable  Complications: No apparent anesthesia complications

## 2014-02-17 NOTE — Discharge Instructions (Signed)
Inguinal Hernia, Adult  °Care After °Refer to this sheet in the next few weeks. These discharge instructions provide you with general information on caring for yourself after you leave the hospital. Your caregiver may also give you specific instructions. Your treatment has been planned according to the most current medical practices available, but unavoidable complications sometimes occur. If you have any problems or questions after discharge, please call your caregiver. °HOME CARE INSTRUCTIONS °· Put ice on the operative site. °¨ Put ice in a plastic bag. °¨ Place a towel between your skin and the bag. °¨ Leave the ice on for 15-20 minutes at a time, 03-04 times a day while awake. °· Change bandages (dressings) as directed. °· Keep the wound dry and clean. The wound may be washed gently with soap and water. Gently blot or dab the wound dry. It is okay to take showers 24 to 48 hours after surgery. Do not take baths, use swimming pools, or use hot tubs for 10 days, or as directed by your caregiver. °· Only take over-the-counter or prescription medicines for pain, discomfort, or fever as directed by your caregiver. °· Continue your normal diet as directed. °· Do not lift anything more than 10 pounds or play contact sports for 3 weeks, or as directed. °SEEK MEDICAL CARE IF: °· There is redness, swelling, or increasing pain in the wound. °· There is fluid (pus) coming from the wound. °· There is drainage from a wound lasting longer than 1 day. °· You have an oral temperature above 102° F (38.9° C). °· You notice a bad smell coming from the wound or dressing. °· The wound breaks open after the stitches (sutures) have been removed. °· You notice increasing pain in the shoulders (shoulder strap areas). °· You develop dizzy episodes or fainting while standing. °· You feel sick to your stomach (nauseous) or throw up (vomit). °SEEK IMMEDIATE MEDICAL CARE IF: °· You develop a rash. °· You have difficulty breathing. °· You  develop a reaction or have side effects to medicines you were given. °MAKE SURE YOU:  °· Understand these instructions. °· Will watch your condition. °· Will get help right away if you are not doing well or get worse. °Document Released: 04/26/2006 Document Revised: 06/18/2011 Document Reviewed: 02/23/2009 °[employer]® Patient Information ©2015 [employer]. This information is not intended to replace advice given to you by your health care provider. Make sure you discuss any questions you have with your health care provider. ° °

## 2014-02-17 NOTE — Anesthesia Procedure Notes (Signed)
Procedure Name: LMA Insertion Date/Time: 02/17/2014 8:34 AM Performed by: Pernell DupreADAMS, AMY A Pre-anesthesia Checklist: Patient identified, Timeout performed, Emergency Drugs available, Suction available and Patient being monitored Patient Re-evaluated:Patient Re-evaluated prior to inductionOxygen Delivery Method: Circle system utilized Preoxygenation: Pre-oxygenation with 100% oxygen Intubation Type: IV induction Ventilation: Mask ventilation without difficulty LMA: LMA inserted LMA Size: 4.0 Placement Confirmation: positive ETCO2 and breath sounds checked- equal and bilateral Tube secured with: Tape Dental Injury: Teeth and Oropharynx as per pre-operative assessment

## 2014-02-18 ENCOUNTER — Encounter (HOSPITAL_COMMUNITY): Payer: Self-pay | Admitting: General Surgery

## 2016-07-26 ENCOUNTER — Ambulatory Visit: Payer: Self-pay | Admitting: Allergy & Immunology

## 2017-04-18 ENCOUNTER — Other Ambulatory Visit (INDEPENDENT_AMBULATORY_CARE_PROVIDER_SITE_OTHER): Payer: Self-pay | Admitting: Otolaryngology

## 2017-04-18 DIAGNOSIS — R221 Localized swelling, mass and lump, neck: Secondary | ICD-10-CM

## 2017-05-01 ENCOUNTER — Ambulatory Visit (HOSPITAL_COMMUNITY)
Admission: RE | Admit: 2017-05-01 | Discharge: 2017-05-01 | Disposition: A | Discharge status: Home / Self Care | Payer: BLUE CROSS/BLUE SHIELD | Source: Ambulatory Visit | Attending: Otolaryngology | Admitting: Otolaryngology

## 2017-05-01 DIAGNOSIS — R221 Localized swelling, mass and lump, neck: Secondary | ICD-10-CM

## 2017-05-01 DIAGNOSIS — K115 Sialolithiasis: Secondary | ICD-10-CM | POA: Insufficient documentation

## 2017-05-01 DIAGNOSIS — K1120 Sialoadenitis, unspecified: Secondary | ICD-10-CM | POA: Insufficient documentation

## 2017-05-01 LAB — POCT I-STAT CREATININE: Creatinine, Ser: 0.9 mg/dL (ref 0.61–1.24)

## 2017-05-01 MED ORDER — IOPAMIDOL (ISOVUE-300) INJECTION 61%
75.0000 mL | Freq: Once | INTRAVENOUS | Status: AC | PRN
  Administered 2017-05-01: 75 mL via INTRAVENOUS

## 2017-05-06 ENCOUNTER — Ambulatory Visit (INDEPENDENT_AMBULATORY_CARE_PROVIDER_SITE_OTHER): Payer: BLUE CROSS/BLUE SHIELD | Admitting: Otolaryngology

## 2017-05-06 DIAGNOSIS — K115 Sialolithiasis: Secondary | ICD-10-CM | POA: Diagnosis not present

## 2017-05-06 DIAGNOSIS — K1123 Chronic sialoadenitis: Secondary | ICD-10-CM

## 2017-06-03 ENCOUNTER — Ambulatory Visit (INDEPENDENT_AMBULATORY_CARE_PROVIDER_SITE_OTHER): Payer: BLUE CROSS/BLUE SHIELD | Admitting: Otolaryngology

## 2017-06-03 DIAGNOSIS — K1123 Chronic sialoadenitis: Secondary | ICD-10-CM | POA: Diagnosis not present

## 2017-09-26 DIAGNOSIS — K115 Sialolithiasis: Secondary | ICD-10-CM | POA: Diagnosis not present

## 2017-11-29 DIAGNOSIS — Z681 Body mass index (BMI) 19 or less, adult: Secondary | ICD-10-CM | POA: Diagnosis not present

## 2017-11-29 DIAGNOSIS — S61412S Laceration without foreign body of left hand, sequela: Secondary | ICD-10-CM | POA: Diagnosis not present

## 2017-11-29 DIAGNOSIS — T63691A Toxic effect of contact with other venomous marine animals, accidental (unintentional), initial encounter: Secondary | ICD-10-CM | POA: Diagnosis not present

## 2017-12-05 DIAGNOSIS — R69 Illness, unspecified: Secondary | ICD-10-CM | POA: Diagnosis not present

## 2018-01-07 DIAGNOSIS — A779 Spotted fever, unspecified: Secondary | ICD-10-CM | POA: Diagnosis not present

## 2018-01-07 DIAGNOSIS — R001 Bradycardia, unspecified: Secondary | ICD-10-CM | POA: Diagnosis not present

## 2018-01-07 DIAGNOSIS — R946 Abnormal results of thyroid function studies: Secondary | ICD-10-CM | POA: Diagnosis not present

## 2018-01-07 DIAGNOSIS — Z13220 Encounter for screening for lipoid disorders: Secondary | ICD-10-CM | POA: Diagnosis not present

## 2018-01-07 DIAGNOSIS — D696 Thrombocytopenia, unspecified: Secondary | ICD-10-CM | POA: Diagnosis not present

## 2018-01-07 DIAGNOSIS — R008 Other abnormalities of heart beat: Secondary | ICD-10-CM | POA: Diagnosis not present

## 2018-01-10 DIAGNOSIS — R946 Abnormal results of thyroid function studies: Secondary | ICD-10-CM | POA: Diagnosis not present

## 2018-01-10 DIAGNOSIS — Z681 Body mass index (BMI) 19 or less, adult: Secondary | ICD-10-CM | POA: Diagnosis not present

## 2018-01-10 DIAGNOSIS — R9431 Abnormal electrocardiogram [ECG] [EKG]: Secondary | ICD-10-CM | POA: Diagnosis not present

## 2018-01-10 DIAGNOSIS — D696 Thrombocytopenia, unspecified: Secondary | ICD-10-CM | POA: Diagnosis not present

## 2018-01-10 DIAGNOSIS — R0789 Other chest pain: Secondary | ICD-10-CM | POA: Diagnosis not present

## 2018-01-10 DIAGNOSIS — R001 Bradycardia, unspecified: Secondary | ICD-10-CM | POA: Diagnosis not present

## 2018-01-10 DIAGNOSIS — Z23 Encounter for immunization: Secondary | ICD-10-CM | POA: Diagnosis not present

## 2018-01-30 DIAGNOSIS — R0789 Other chest pain: Secondary | ICD-10-CM | POA: Diagnosis not present

## 2018-01-30 DIAGNOSIS — R001 Bradycardia, unspecified: Secondary | ICD-10-CM | POA: Diagnosis not present

## 2018-06-10 DIAGNOSIS — R69 Illness, unspecified: Secondary | ICD-10-CM | POA: Diagnosis not present

## 2018-07-09 IMAGING — CT CT NECK W/ CM
4 of 5 series · 15 of 33 positions shown, 17 images · IV contrast (iopamidol)
Comparison: None.

CLINICAL DATA: Left-sided neck mass under the mandible, first
discovered in [DATE]. Status post antibiotic treatment.

EXAM:
CT NECK WITH CONTRAST
TECHNIQUE: Multidetector CT imaging of the neck was performed using the
standard protocol following the bolus administration of intravenous
contrast.
CONTRAST:  75mL [id number] IOPAMIDOL ([id number]) INJECTION 61%

[Series 2: axial neck · axial · 0.48mm/px · z∈[-131,-37]mm · 3 of 118 slices shown]
[im 24/118  bone]
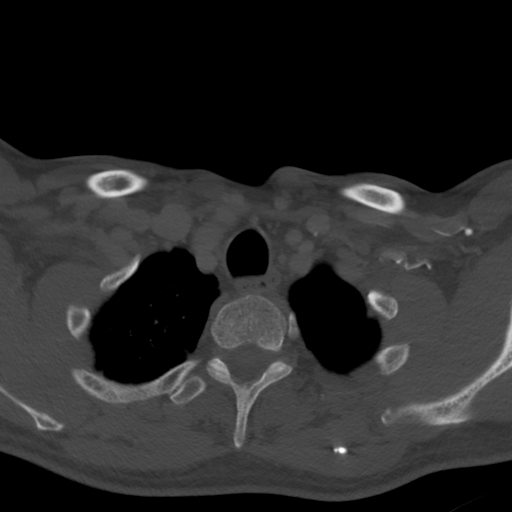
[im 47/118  bone]
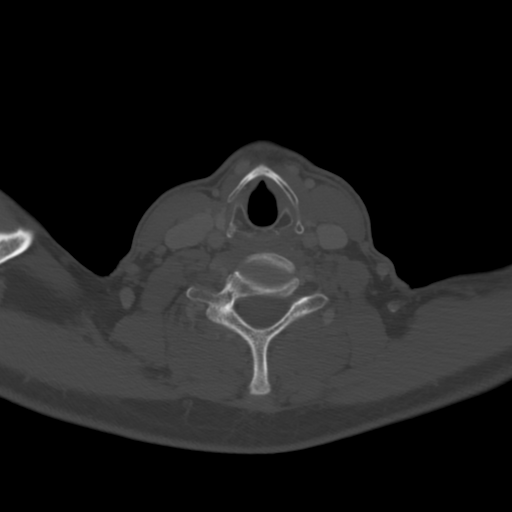
[im 71/118  bone]
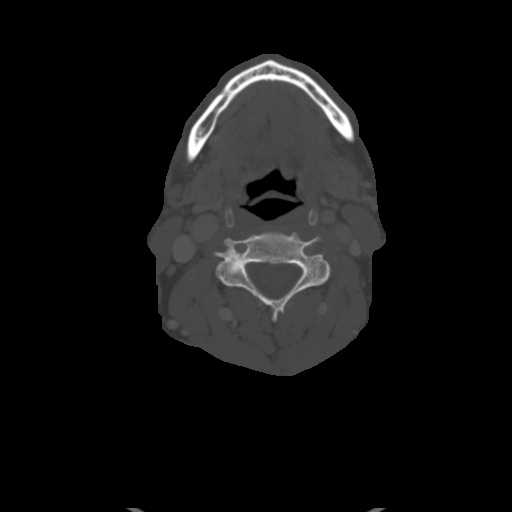

[Series 6: coronal neck · coronal · 0.51mm/px · 3 of 103 slices shown]
[im 21/103  bone]
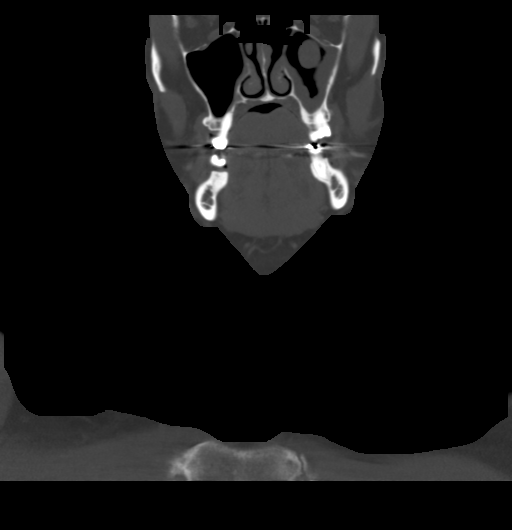
[im 41/103  bone]
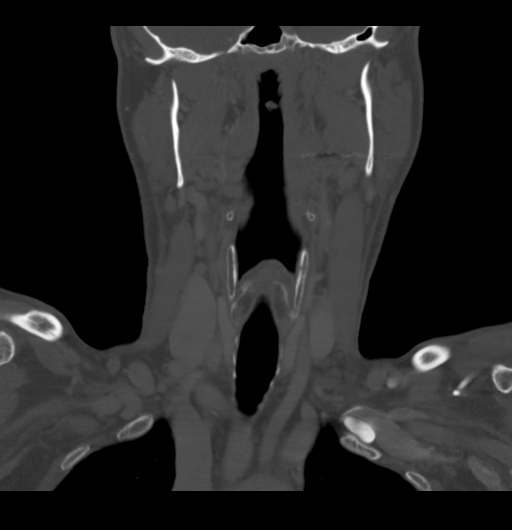
[im 62/103  bone]
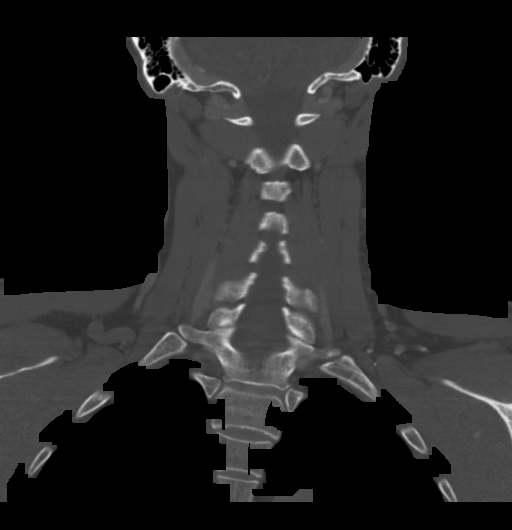

[Series 7: sagittal neck · sagittal · 0.46mm/px · 5 of 101 slices shown, 6 images]
[im 34/101  bone]
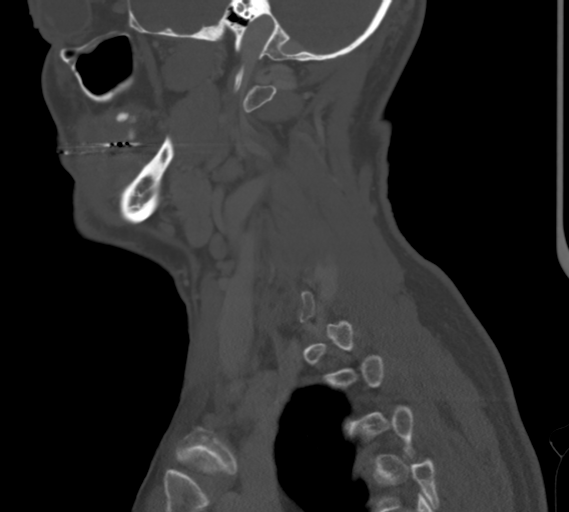
[im 42/101  bone]
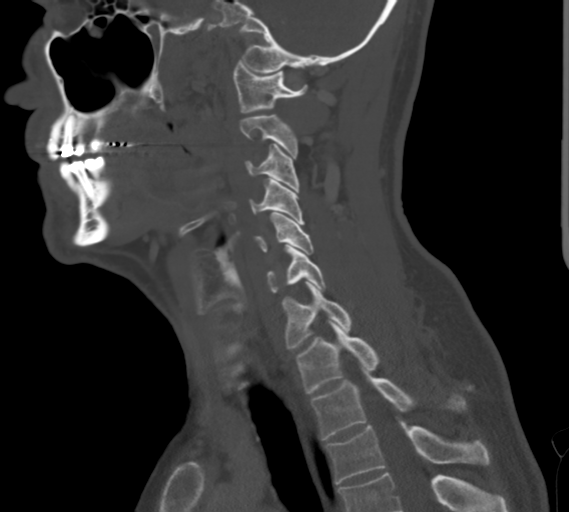
[im 51/101  soft-tissue]
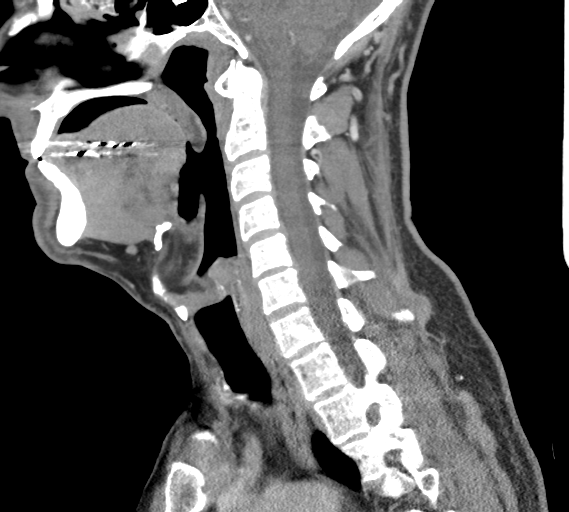
[im 51/101  bone]
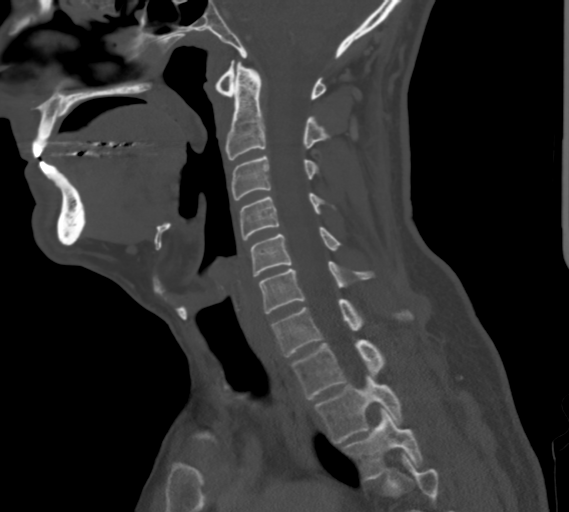
[im 59/101  bone]
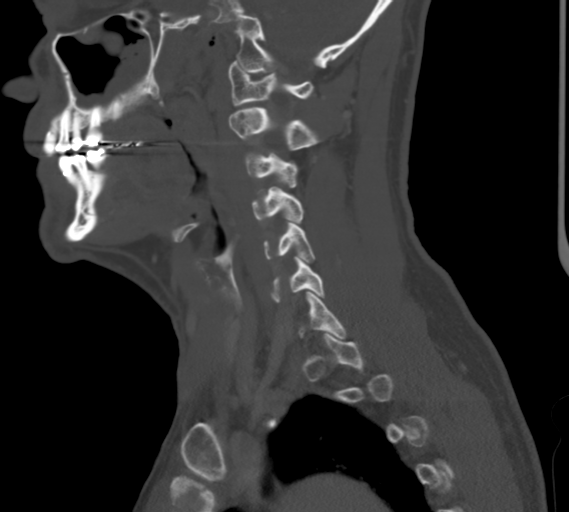
[im 67/101  bone]
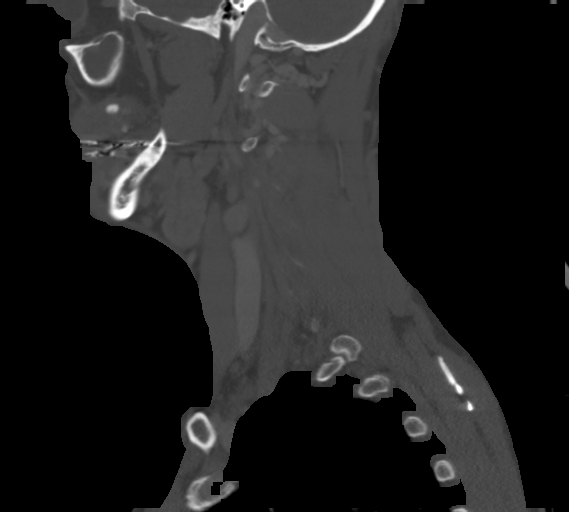

[Series 8: orthogonal ax · axial · 0.39mm/px · z∈[-157,+3]mm · 4 of 136 slices shown, 5 images]
[im 28/136  soft-tissue]
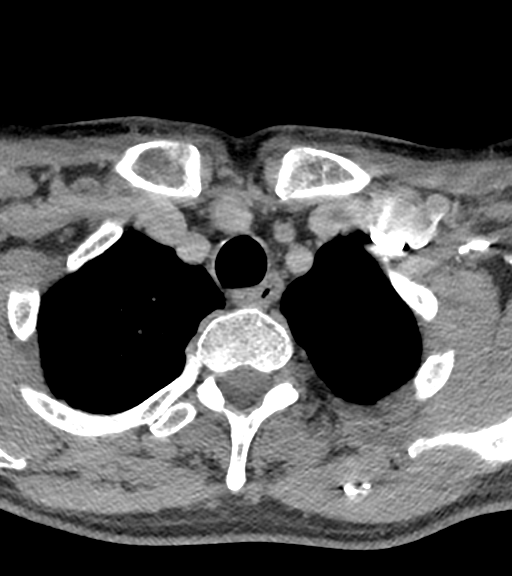
[im 28/136  bone]
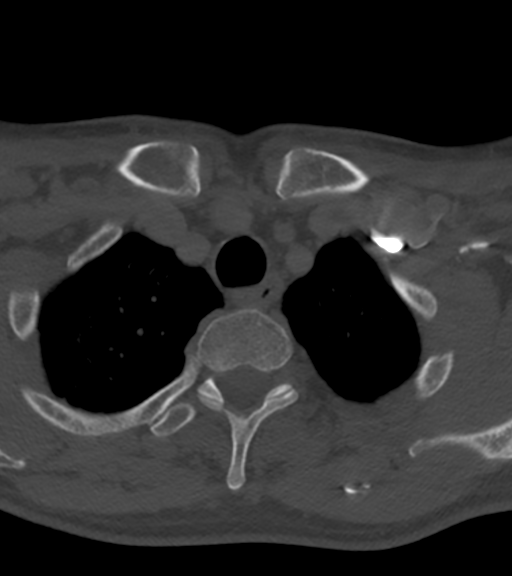
[im 55/136  bone]
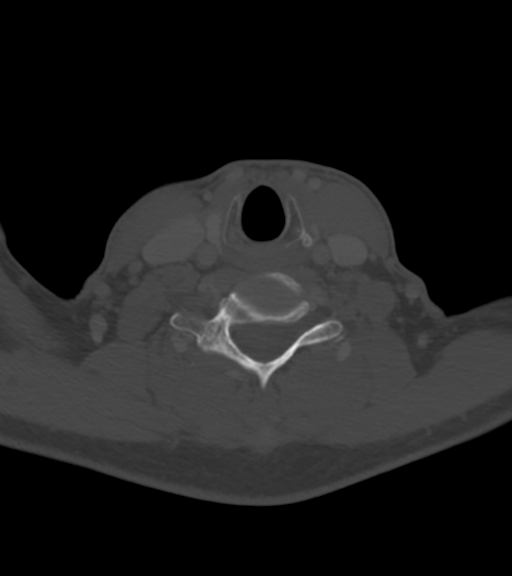
[im 82/136  bone]
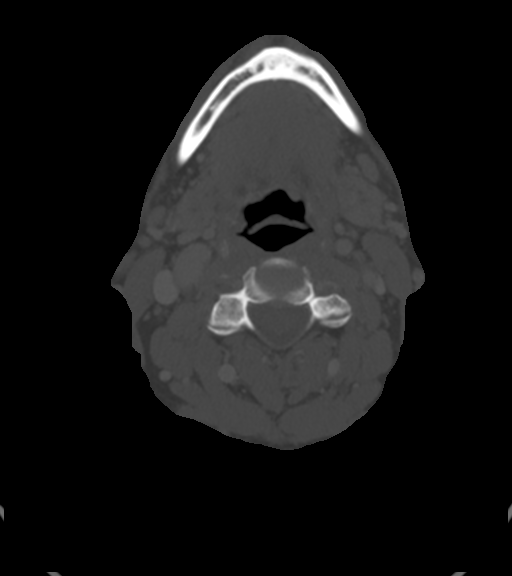
[im 109/136  bone]
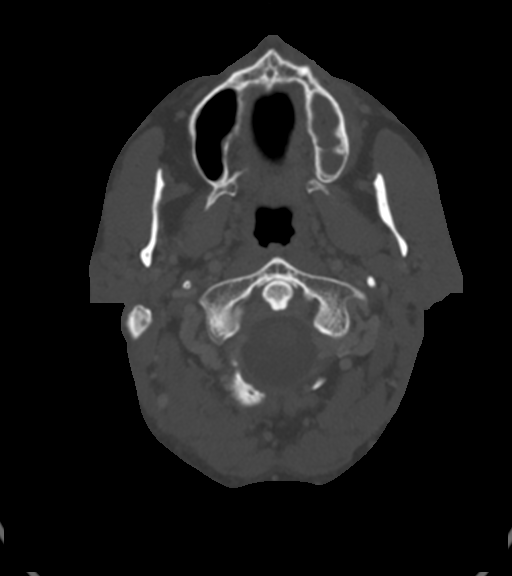

[15 of 33 positions shown; findings below may reference images not displayed]

FINDINGS: Pharynx and larynx: Symmetric pharyngeal soft tissues without
evidence of mass. Unremarkable larynx. No significant
retropharyngeal fluid.

Salivary glands: 5 mm calculus anteriorly in the left submandibular
duct with mild ductal dilatation proximally extending into the
gland. Mild inflammatory stranding extends inferiorly from the left
submandibular gland, and the skin marker used indicate the area of
clinical concern is in this region. Right submandibular and
bilateral parotid glands are unremarkable.

Thyroid: Unremarkable.

Lymph nodes: No enlarged or suspicious lymph nodes in the neck.
Slightly prominent number of normal-sized anterior cervical lymph
nodes bilaterally including in levels I and II, all subcentimeter in
short axis and likely benign.

Vascular: Major vascular structures of the neck are grossly patent.

Limited intracranial: Unremarkable.

Visualized orbits: Unremarkable.

Mastoids and visualized paranasal sinuses: Moderate left maxillary
sinus mucosal thickening, partially polypoid. Effacement of the left
maxillary sinus ostium and infundibulum. Clear mastoid air cells.

Skeleton: No acute osseous abnormality or suspicious osseous lesion.

Upper chest: Minimal asymmetric pleural thickening and scarring in
the right lung apex.

Other: None.
IMPRESSION: 5 mm left submandibular duct calculus with mild ductal dilatation
and evidence of mild submandibular gland sialadenitis.

## 2018-07-14 DIAGNOSIS — Z13220 Encounter for screening for lipoid disorders: Secondary | ICD-10-CM | POA: Diagnosis not present

## 2018-07-14 DIAGNOSIS — R001 Bradycardia, unspecified: Secondary | ICD-10-CM | POA: Diagnosis not present

## 2018-07-14 DIAGNOSIS — K5730 Diverticulosis of large intestine without perforation or abscess without bleeding: Secondary | ICD-10-CM | POA: Diagnosis not present

## 2018-07-14 DIAGNOSIS — R946 Abnormal results of thyroid function studies: Secondary | ICD-10-CM | POA: Diagnosis not present

## 2018-07-14 DIAGNOSIS — N400 Enlarged prostate without lower urinary tract symptoms: Secondary | ICD-10-CM | POA: Diagnosis not present

## 2018-07-17 DIAGNOSIS — I351 Nonrheumatic aortic (valve) insufficiency: Secondary | ICD-10-CM | POA: Diagnosis not present

## 2018-07-17 DIAGNOSIS — Z23 Encounter for immunization: Secondary | ICD-10-CM | POA: Diagnosis not present

## 2018-07-17 DIAGNOSIS — I517 Cardiomegaly: Secondary | ICD-10-CM | POA: Diagnosis not present

## 2018-07-17 DIAGNOSIS — L501 Idiopathic urticaria: Secondary | ICD-10-CM | POA: Diagnosis not present

## 2018-07-17 DIAGNOSIS — Z0000 Encounter for general adult medical examination without abnormal findings: Secondary | ICD-10-CM | POA: Diagnosis not present

## 2018-07-17 DIAGNOSIS — R9431 Abnormal electrocardiogram [ECG] [EKG]: Secondary | ICD-10-CM | POA: Diagnosis not present

## 2018-07-17 DIAGNOSIS — R001 Bradycardia, unspecified: Secondary | ICD-10-CM | POA: Diagnosis not present

## 2018-07-17 DIAGNOSIS — Z6820 Body mass index (BMI) 20.0-20.9, adult: Secondary | ICD-10-CM | POA: Diagnosis not present

## 2018-08-26 DIAGNOSIS — H40023 Open angle with borderline findings, high risk, bilateral: Secondary | ICD-10-CM | POA: Diagnosis not present

## 2018-08-26 DIAGNOSIS — H25013 Cortical age-related cataract, bilateral: Secondary | ICD-10-CM | POA: Diagnosis not present

## 2018-08-26 DIAGNOSIS — H2513 Age-related nuclear cataract, bilateral: Secondary | ICD-10-CM | POA: Diagnosis not present

## 2018-08-26 DIAGNOSIS — H52203 Unspecified astigmatism, bilateral: Secondary | ICD-10-CM | POA: Diagnosis not present

## 2018-08-29 DIAGNOSIS — H40023 Open angle with borderline findings, high risk, bilateral: Secondary | ICD-10-CM | POA: Diagnosis not present

## 2018-12-16 DIAGNOSIS — R69 Illness, unspecified: Secondary | ICD-10-CM | POA: Diagnosis not present

## 2019-01-19 DIAGNOSIS — R69 Illness, unspecified: Secondary | ICD-10-CM | POA: Diagnosis not present

## 2019-02-05 DIAGNOSIS — R946 Abnormal results of thyroid function studies: Secondary | ICD-10-CM | POA: Diagnosis not present

## 2019-02-05 DIAGNOSIS — E559 Vitamin D deficiency, unspecified: Secondary | ICD-10-CM | POA: Diagnosis not present

## 2019-02-11 DIAGNOSIS — L501 Idiopathic urticaria: Secondary | ICD-10-CM | POA: Diagnosis not present

## 2019-02-11 DIAGNOSIS — R001 Bradycardia, unspecified: Secondary | ICD-10-CM | POA: Diagnosis not present

## 2019-02-11 DIAGNOSIS — I517 Cardiomegaly: Secondary | ICD-10-CM | POA: Diagnosis not present

## 2019-02-11 DIAGNOSIS — R9431 Abnormal electrocardiogram [ECG] [EKG]: Secondary | ICD-10-CM | POA: Diagnosis not present

## 2019-02-11 DIAGNOSIS — R69 Illness, unspecified: Secondary | ICD-10-CM | POA: Diagnosis not present

## 2019-02-11 DIAGNOSIS — I351 Nonrheumatic aortic (valve) insufficiency: Secondary | ICD-10-CM | POA: Diagnosis not present

## 2019-02-11 DIAGNOSIS — Z0001 Encounter for general adult medical examination with abnormal findings: Secondary | ICD-10-CM | POA: Diagnosis not present

## 2019-02-11 DIAGNOSIS — Z6821 Body mass index (BMI) 21.0-21.9, adult: Secondary | ICD-10-CM | POA: Diagnosis not present

## 2019-02-22 DIAGNOSIS — Z809 Family history of malignant neoplasm, unspecified: Secondary | ICD-10-CM | POA: Diagnosis not present

## 2019-02-22 DIAGNOSIS — J309 Allergic rhinitis, unspecified: Secondary | ICD-10-CM | POA: Diagnosis not present

## 2019-02-22 DIAGNOSIS — Z823 Family history of stroke: Secondary | ICD-10-CM | POA: Diagnosis not present

## 2019-02-22 DIAGNOSIS — F419 Anxiety disorder, unspecified: Secondary | ICD-10-CM | POA: Diagnosis not present

## 2019-02-22 DIAGNOSIS — R69 Illness, unspecified: Secondary | ICD-10-CM | POA: Diagnosis not present

## 2019-03-03 DIAGNOSIS — H524 Presbyopia: Secondary | ICD-10-CM | POA: Diagnosis not present

## 2019-03-03 DIAGNOSIS — H25013 Cortical age-related cataract, bilateral: Secondary | ICD-10-CM | POA: Diagnosis not present

## 2019-03-03 DIAGNOSIS — H40023 Open angle with borderline findings, high risk, bilateral: Secondary | ICD-10-CM | POA: Diagnosis not present

## 2019-03-03 DIAGNOSIS — H2513 Age-related nuclear cataract, bilateral: Secondary | ICD-10-CM | POA: Diagnosis not present

## 2019-06-22 DIAGNOSIS — R69 Illness, unspecified: Secondary | ICD-10-CM | POA: Diagnosis not present

## 2019-08-18 DIAGNOSIS — R001 Bradycardia, unspecified: Secondary | ICD-10-CM | POA: Diagnosis not present

## 2019-08-18 DIAGNOSIS — Z13220 Encounter for screening for lipoid disorders: Secondary | ICD-10-CM | POA: Diagnosis not present

## 2019-08-18 DIAGNOSIS — R5383 Other fatigue: Secondary | ICD-10-CM | POA: Diagnosis not present

## 2019-08-18 DIAGNOSIS — D696 Thrombocytopenia, unspecified: Secondary | ICD-10-CM | POA: Diagnosis not present

## 2019-08-18 DIAGNOSIS — N281 Cyst of kidney, acquired: Secondary | ICD-10-CM | POA: Diagnosis not present

## 2019-08-21 DIAGNOSIS — Z6822 Body mass index (BMI) 22.0-22.9, adult: Secondary | ICD-10-CM | POA: Diagnosis not present

## 2019-08-21 DIAGNOSIS — D696 Thrombocytopenia, unspecified: Secondary | ICD-10-CM | POA: Diagnosis not present

## 2019-08-21 DIAGNOSIS — R001 Bradycardia, unspecified: Secondary | ICD-10-CM | POA: Diagnosis not present

## 2019-08-21 DIAGNOSIS — R69 Illness, unspecified: Secondary | ICD-10-CM | POA: Diagnosis not present

## 2019-09-02 DIAGNOSIS — H40023 Open angle with borderline findings, high risk, bilateral: Secondary | ICD-10-CM | POA: Diagnosis not present

## 2019-12-28 DIAGNOSIS — R69 Illness, unspecified: Secondary | ICD-10-CM | POA: Diagnosis not present

## 2020-02-01 DIAGNOSIS — R69 Illness, unspecified: Secondary | ICD-10-CM | POA: Diagnosis not present

## 2020-02-05 DIAGNOSIS — Z0001 Encounter for general adult medical examination with abnormal findings: Secondary | ICD-10-CM | POA: Diagnosis not present

## 2020-02-05 DIAGNOSIS — Z1329 Encounter for screening for other suspected endocrine disorder: Secondary | ICD-10-CM | POA: Diagnosis not present

## 2020-02-05 DIAGNOSIS — R5383 Other fatigue: Secondary | ICD-10-CM | POA: Diagnosis not present

## 2020-02-05 DIAGNOSIS — E559 Vitamin D deficiency, unspecified: Secondary | ICD-10-CM | POA: Diagnosis not present

## 2020-02-10 DIAGNOSIS — R946 Abnormal results of thyroid function studies: Secondary | ICD-10-CM | POA: Diagnosis not present

## 2020-02-10 DIAGNOSIS — I351 Nonrheumatic aortic (valve) insufficiency: Secondary | ICD-10-CM | POA: Diagnosis not present

## 2020-02-10 DIAGNOSIS — Z0001 Encounter for general adult medical examination with abnormal findings: Secondary | ICD-10-CM | POA: Diagnosis not present

## 2020-02-10 DIAGNOSIS — I517 Cardiomegaly: Secondary | ICD-10-CM | POA: Diagnosis not present

## 2020-02-10 DIAGNOSIS — Z6822 Body mass index (BMI) 22.0-22.9, adult: Secondary | ICD-10-CM | POA: Diagnosis not present

## 2020-02-10 DIAGNOSIS — D696 Thrombocytopenia, unspecified: Secondary | ICD-10-CM | POA: Diagnosis not present

## 2020-02-10 DIAGNOSIS — Z23 Encounter for immunization: Secondary | ICD-10-CM | POA: Diagnosis not present

## 2020-02-10 DIAGNOSIS — R001 Bradycardia, unspecified: Secondary | ICD-10-CM | POA: Diagnosis not present

## 2020-03-09 DIAGNOSIS — H43813 Vitreous degeneration, bilateral: Secondary | ICD-10-CM | POA: Diagnosis not present

## 2020-03-09 DIAGNOSIS — H524 Presbyopia: Secondary | ICD-10-CM | POA: Diagnosis not present

## 2020-03-09 DIAGNOSIS — H40023 Open angle with borderline findings, high risk, bilateral: Secondary | ICD-10-CM | POA: Diagnosis not present

## 2020-03-09 DIAGNOSIS — H25013 Cortical age-related cataract, bilateral: Secondary | ICD-10-CM | POA: Diagnosis not present

## 2020-03-10 DIAGNOSIS — I77810 Thoracic aortic ectasia: Secondary | ICD-10-CM | POA: Diagnosis not present

## 2020-03-10 DIAGNOSIS — I351 Nonrheumatic aortic (valve) insufficiency: Secondary | ICD-10-CM | POA: Diagnosis not present

## 2020-03-10 DIAGNOSIS — I517 Cardiomegaly: Secondary | ICD-10-CM | POA: Diagnosis not present

## 2020-08-04 DIAGNOSIS — R5383 Other fatigue: Secondary | ICD-10-CM | POA: Diagnosis not present

## 2020-08-04 DIAGNOSIS — R0789 Other chest pain: Secondary | ICD-10-CM | POA: Diagnosis not present

## 2020-08-04 DIAGNOSIS — E559 Vitamin D deficiency, unspecified: Secondary | ICD-10-CM | POA: Diagnosis not present

## 2020-08-04 DIAGNOSIS — N400 Enlarged prostate without lower urinary tract symptoms: Secondary | ICD-10-CM | POA: Diagnosis not present

## 2020-08-04 DIAGNOSIS — D696 Thrombocytopenia, unspecified: Secondary | ICD-10-CM | POA: Diagnosis not present

## 2020-08-09 DIAGNOSIS — Z1389 Encounter for screening for other disorder: Secondary | ICD-10-CM | POA: Diagnosis not present

## 2020-08-09 DIAGNOSIS — Z1331 Encounter for screening for depression: Secondary | ICD-10-CM | POA: Diagnosis not present

## 2020-08-09 DIAGNOSIS — I351 Nonrheumatic aortic (valve) insufficiency: Secondary | ICD-10-CM | POA: Diagnosis not present

## 2020-08-09 DIAGNOSIS — D696 Thrombocytopenia, unspecified: Secondary | ICD-10-CM | POA: Diagnosis not present

## 2020-08-09 DIAGNOSIS — R946 Abnormal results of thyroid function studies: Secondary | ICD-10-CM | POA: Diagnosis not present

## 2020-08-09 DIAGNOSIS — R69 Illness, unspecified: Secondary | ICD-10-CM | POA: Diagnosis not present

## 2020-08-09 DIAGNOSIS — Z6822 Body mass index (BMI) 22.0-22.9, adult: Secondary | ICD-10-CM | POA: Diagnosis not present

## 2020-08-09 DIAGNOSIS — R001 Bradycardia, unspecified: Secondary | ICD-10-CM | POA: Diagnosis not present

## 2020-09-06 DIAGNOSIS — H2513 Age-related nuclear cataract, bilateral: Secondary | ICD-10-CM | POA: Diagnosis not present

## 2020-09-06 DIAGNOSIS — H40023 Open angle with borderline findings, high risk, bilateral: Secondary | ICD-10-CM | POA: Diagnosis not present

## 2020-09-06 DIAGNOSIS — H25013 Cortical age-related cataract, bilateral: Secondary | ICD-10-CM | POA: Diagnosis not present

## 2020-09-20 DIAGNOSIS — L0390 Cellulitis, unspecified: Secondary | ICD-10-CM | POA: Diagnosis not present

## 2020-09-20 DIAGNOSIS — Z6821 Body mass index (BMI) 21.0-21.9, adult: Secondary | ICD-10-CM | POA: Diagnosis not present

## 2021-02-13 DIAGNOSIS — E559 Vitamin D deficiency, unspecified: Secondary | ICD-10-CM | POA: Diagnosis not present

## 2021-02-13 DIAGNOSIS — Z13220 Encounter for screening for lipoid disorders: Secondary | ICD-10-CM | POA: Diagnosis not present

## 2021-02-13 DIAGNOSIS — R946 Abnormal results of thyroid function studies: Secondary | ICD-10-CM | POA: Diagnosis not present

## 2021-03-08 DIAGNOSIS — H524 Presbyopia: Secondary | ICD-10-CM | POA: Diagnosis not present

## 2021-03-08 DIAGNOSIS — H25013 Cortical age-related cataract, bilateral: Secondary | ICD-10-CM | POA: Diagnosis not present

## 2021-03-08 DIAGNOSIS — H43813 Vitreous degeneration, bilateral: Secondary | ICD-10-CM | POA: Diagnosis not present

## 2021-03-08 DIAGNOSIS — H40013 Open angle with borderline findings, low risk, bilateral: Secondary | ICD-10-CM | POA: Diagnosis not present

## 2021-03-16 DIAGNOSIS — Z23 Encounter for immunization: Secondary | ICD-10-CM | POA: Diagnosis not present

## 2021-03-16 DIAGNOSIS — I77810 Thoracic aortic ectasia: Secondary | ICD-10-CM | POA: Diagnosis not present

## 2021-03-16 DIAGNOSIS — R001 Bradycardia, unspecified: Secondary | ICD-10-CM | POA: Diagnosis not present

## 2021-03-16 DIAGNOSIS — F330 Major depressive disorder, recurrent, mild: Secondary | ICD-10-CM | POA: Diagnosis not present

## 2021-03-16 DIAGNOSIS — R69 Illness, unspecified: Secondary | ICD-10-CM | POA: Diagnosis not present

## 2021-03-16 DIAGNOSIS — I351 Nonrheumatic aortic (valve) insufficiency: Secondary | ICD-10-CM | POA: Diagnosis not present

## 2021-03-16 DIAGNOSIS — Z0001 Encounter for general adult medical examination with abnormal findings: Secondary | ICD-10-CM | POA: Diagnosis not present

## 2021-03-16 DIAGNOSIS — R946 Abnormal results of thyroid function studies: Secondary | ICD-10-CM | POA: Diagnosis not present

## 2021-03-16 DIAGNOSIS — D696 Thrombocytopenia, unspecified: Secondary | ICD-10-CM | POA: Diagnosis not present

## 2021-04-04 DIAGNOSIS — Z1212 Encounter for screening for malignant neoplasm of rectum: Secondary | ICD-10-CM | POA: Diagnosis not present

## 2021-04-04 DIAGNOSIS — Z1211 Encounter for screening for malignant neoplasm of colon: Secondary | ICD-10-CM | POA: Diagnosis not present

## 2021-04-13 LAB — COLOGUARD: COLOGUARD: POSITIVE — AB

## 2021-04-17 DIAGNOSIS — I517 Cardiomegaly: Secondary | ICD-10-CM | POA: Diagnosis not present

## 2021-04-17 DIAGNOSIS — I77810 Thoracic aortic ectasia: Secondary | ICD-10-CM | POA: Diagnosis not present

## 2021-04-17 DIAGNOSIS — R001 Bradycardia, unspecified: Secondary | ICD-10-CM | POA: Diagnosis not present

## 2021-04-20 DIAGNOSIS — H25811 Combined forms of age-related cataract, right eye: Secondary | ICD-10-CM | POA: Diagnosis not present

## 2021-04-20 DIAGNOSIS — H52221 Regular astigmatism, right eye: Secondary | ICD-10-CM | POA: Diagnosis not present

## 2021-04-20 DIAGNOSIS — H25011 Cortical age-related cataract, right eye: Secondary | ICD-10-CM | POA: Diagnosis not present

## 2021-05-03 DIAGNOSIS — Z6822 Body mass index (BMI) 22.0-22.9, adult: Secondary | ICD-10-CM | POA: Diagnosis not present

## 2021-05-03 DIAGNOSIS — R195 Other fecal abnormalities: Secondary | ICD-10-CM | POA: Diagnosis not present

## 2021-05-04 DIAGNOSIS — H2512 Age-related nuclear cataract, left eye: Secondary | ICD-10-CM | POA: Diagnosis not present

## 2021-05-04 DIAGNOSIS — H25812 Combined forms of age-related cataract, left eye: Secondary | ICD-10-CM | POA: Diagnosis not present

## 2021-05-04 DIAGNOSIS — H25012 Cortical age-related cataract, left eye: Secondary | ICD-10-CM | POA: Diagnosis not present

## 2021-05-04 DIAGNOSIS — H52222 Regular astigmatism, left eye: Secondary | ICD-10-CM | POA: Diagnosis not present

## 2021-06-08 DIAGNOSIS — R001 Bradycardia, unspecified: Secondary | ICD-10-CM | POA: Diagnosis not present

## 2021-06-08 DIAGNOSIS — D696 Thrombocytopenia, unspecified: Secondary | ICD-10-CM | POA: Diagnosis not present

## 2021-06-08 DIAGNOSIS — K5730 Diverticulosis of large intestine without perforation or abscess without bleeding: Secondary | ICD-10-CM | POA: Diagnosis not present

## 2021-06-08 DIAGNOSIS — R195 Other fecal abnormalities: Secondary | ICD-10-CM | POA: Diagnosis not present

## 2021-06-08 DIAGNOSIS — Z1211 Encounter for screening for malignant neoplasm of colon: Secondary | ICD-10-CM | POA: Diagnosis not present

## 2021-06-08 DIAGNOSIS — Z79899 Other long term (current) drug therapy: Secondary | ICD-10-CM | POA: Diagnosis not present

## 2021-06-08 DIAGNOSIS — G4730 Sleep apnea, unspecified: Secondary | ICD-10-CM | POA: Diagnosis not present

## 2021-06-16 DIAGNOSIS — Z961 Presence of intraocular lens: Secondary | ICD-10-CM | POA: Diagnosis not present

## 2021-08-12 DIAGNOSIS — L03011 Cellulitis of right finger: Secondary | ICD-10-CM | POA: Diagnosis not present

## 2021-08-12 DIAGNOSIS — Z6822 Body mass index (BMI) 22.0-22.9, adult: Secondary | ICD-10-CM | POA: Diagnosis not present

## 2021-09-18 DIAGNOSIS — Z13220 Encounter for screening for lipoid disorders: Secondary | ICD-10-CM | POA: Diagnosis not present

## 2021-09-18 DIAGNOSIS — R5383 Other fatigue: Secondary | ICD-10-CM | POA: Diagnosis not present

## 2021-09-18 DIAGNOSIS — E785 Hyperlipidemia, unspecified: Secondary | ICD-10-CM | POA: Diagnosis not present

## 2021-10-09 DIAGNOSIS — I351 Nonrheumatic aortic (valve) insufficiency: Secondary | ICD-10-CM | POA: Diagnosis not present

## 2021-10-09 DIAGNOSIS — F330 Major depressive disorder, recurrent, mild: Secondary | ICD-10-CM | POA: Diagnosis not present

## 2021-10-09 DIAGNOSIS — I77810 Thoracic aortic ectasia: Secondary | ICD-10-CM | POA: Diagnosis not present

## 2021-10-09 DIAGNOSIS — D696 Thrombocytopenia, unspecified: Secondary | ICD-10-CM | POA: Diagnosis not present

## 2021-10-09 DIAGNOSIS — R69 Illness, unspecified: Secondary | ICD-10-CM | POA: Diagnosis not present

## 2021-10-09 DIAGNOSIS — Z6822 Body mass index (BMI) 22.0-22.9, adult: Secondary | ICD-10-CM | POA: Diagnosis not present

## 2021-11-03 ENCOUNTER — Observation Stay (HOSPITAL_COMMUNITY): Payer: Medicare HMO

## 2021-11-03 ENCOUNTER — Encounter (HOSPITAL_COMMUNITY): Payer: Self-pay | Admitting: Emergency Medicine

## 2021-11-03 ENCOUNTER — Observation Stay (HOSPITAL_COMMUNITY)
Admission: EM | Admit: 2021-11-03 | Discharge: 2021-11-05 | Disposition: A | Discharge status: Home / Self Care | Payer: Medicare HMO | Attending: Internal Medicine | Admitting: Internal Medicine

## 2021-11-03 ENCOUNTER — Emergency Department (HOSPITAL_COMMUNITY): Payer: Medicare HMO

## 2021-11-03 DIAGNOSIS — H547 Unspecified visual loss: Secondary | ICD-10-CM

## 2021-11-03 DIAGNOSIS — H5340 Unspecified visual field defects: Secondary | ICD-10-CM | POA: Diagnosis present

## 2021-11-03 DIAGNOSIS — Z79899 Other long term (current) drug therapy: Secondary | ICD-10-CM | POA: Diagnosis not present

## 2021-11-03 DIAGNOSIS — H5461 Unqualified visual loss, right eye, normal vision left eye: Secondary | ICD-10-CM | POA: Diagnosis not present

## 2021-11-03 DIAGNOSIS — R29818 Other symptoms and signs involving the nervous system: Secondary | ICD-10-CM | POA: Diagnosis not present

## 2021-11-03 DIAGNOSIS — G459 Transient cerebral ischemic attack, unspecified: Secondary | ICD-10-CM | POA: Diagnosis not present

## 2021-11-03 DIAGNOSIS — J320 Chronic maxillary sinusitis: Secondary | ICD-10-CM | POA: Diagnosis not present

## 2021-11-03 DIAGNOSIS — I5032 Chronic diastolic (congestive) heart failure: Secondary | ICD-10-CM | POA: Diagnosis not present

## 2021-11-03 DIAGNOSIS — R69 Illness, unspecified: Secondary | ICD-10-CM | POA: Diagnosis not present

## 2021-11-03 DIAGNOSIS — F411 Generalized anxiety disorder: Secondary | ICD-10-CM

## 2021-11-03 DIAGNOSIS — I6523 Occlusion and stenosis of bilateral carotid arteries: Secondary | ICD-10-CM | POA: Diagnosis not present

## 2021-11-03 DIAGNOSIS — G453 Amaurosis fugax: Secondary | ICD-10-CM | POA: Diagnosis not present

## 2021-11-03 DIAGNOSIS — R001 Bradycardia, unspecified: Secondary | ICD-10-CM | POA: Diagnosis not present

## 2021-11-03 LAB — DIFFERENTIAL
Abs Immature Granulocytes: 0.02 10*3/uL (ref 0.00–0.07)
Basophils Absolute: 0 10*3/uL (ref 0.0–0.1)
Basophils Relative: 0 %
Eosinophils Absolute: 0.3 10*3/uL (ref 0.0–0.5)
Eosinophils Relative: 4 %
Immature Granulocytes: 0 %
Lymphocytes Relative: 39 %
Lymphs Abs: 3 10*3/uL (ref 0.7–4.0)
Monocytes Absolute: 0.7 10*3/uL (ref 0.1–1.0)
Monocytes Relative: 8 %
Neutro Abs: 3.7 10*3/uL (ref 1.7–7.7)
Neutrophils Relative %: 49 %

## 2021-11-03 LAB — COMPREHENSIVE METABOLIC PANEL
ALT: 15 U/L (ref 0–44)
AST: 22 U/L (ref 15–41)
Albumin: 4.3 g/dL (ref 3.5–5.0)
Alkaline Phosphatase: 30 U/L — ABNORMAL LOW (ref 38–126)
Anion gap: 6 (ref 5–15)
BUN: 12 mg/dL (ref 8–23)
CO2: 28 mmol/L (ref 22–32)
Calcium: 9.3 mg/dL (ref 8.9–10.3)
Chloride: 105 mmol/L (ref 98–111)
Creatinine, Ser: 0.84 mg/dL (ref 0.61–1.24)
GFR, Estimated: >60 mL/min (ref >60)
Glucose, Bld: 95 mg/dL (ref 70–99)
Potassium: 4.4 mmol/L (ref 3.5–5.1)
Sodium: 139 mmol/L (ref 135–145)
Total Bilirubin: 1.5 mg/dL — ABNORMAL HIGH (ref 0.3–1.2)
Total Protein: 7.1 g/dL (ref 6.5–8.1)

## 2021-11-03 LAB — CBC
HCT: 45.2 % (ref 39.0–52.0)
Hemoglobin: 15.4 g/dL (ref 13.0–17.0)
MCH: 33.1 pg (ref 26.0–34.0)
MCHC: 34.1 g/dL (ref 30.0–36.0)
MCV: 97.2 fL (ref 80.0–100.0)
Platelets: 151 10*3/uL (ref 150–400)
RBC: 4.65 MIL/uL (ref 4.22–5.81)
RDW: 12.3 % (ref 11.5–15.5)
WBC: 7.7 10*3/uL (ref 4.0–10.5)
nRBC: 0 % (ref 0.0–0.2)

## 2021-11-03 LAB — I-STAT CHEM 8, ED
BUN: 15 mg/dL (ref 8–23)
Calcium, Ion: 1.1 mmol/L — ABNORMAL LOW (ref 1.15–1.40)
Chloride: 102 mmol/L (ref 98–111)
Creatinine, Ser: 0.8 mg/dL (ref 0.61–1.24)
Glucose, Bld: 86 mg/dL (ref 70–99)
HCT: 45 % (ref 39.0–52.0)
Hemoglobin: 15.3 g/dL (ref 13.0–17.0)
Potassium: 4.3 mmol/L (ref 3.5–5.1)
Sodium: 139 mmol/L (ref 135–145)
TCO2: 26 mmol/L (ref 22–32)

## 2021-11-03 LAB — PROTIME-INR
INR: 1.1 (ref 0.8–1.2)
Prothrombin Time: 14.4 seconds (ref 11.4–15.2)

## 2021-11-03 LAB — APTT: aPTT: 36 seconds (ref 24–36)

## 2021-11-03 LAB — ETHANOL: Alcohol, Ethyl (B): <10 mg/dL (ref <10)

## 2021-11-03 MED ORDER — ACETAMINOPHEN 325 MG PO TABS: 650.0000 mg | ORAL_TABLET | Freq: Four times a day (QID) | ORAL | Status: DC | PRN

## 2021-11-03 MED ORDER — SODIUM CHLORIDE 0.9% FLUSH: 3.0000 mL | Freq: Once | INTRAVENOUS | Status: DC

## 2021-11-03 MED ORDER — POLYETHYLENE GLYCOL 3350 17 G PO PACK: 17.0000 g | PACK | Freq: Every day | ORAL | Status: DC | PRN

## 2021-11-03 MED ORDER — ENOXAPARIN SODIUM 40 MG/0.4ML IJ SOSY
40.0000 mg | PREFILLED_SYRINGE | INTRAMUSCULAR | Status: DC
  Administered 2021-11-04 – 2021-11-05 (×2): 40 mg via SUBCUTANEOUS
  Filled 2021-11-03 (×2): qty 0.4

## 2021-11-03 MED ORDER — CITALOPRAM HYDROBROMIDE 10 MG PO TABS
10.0000 mg | ORAL_TABLET | Freq: Every day | ORAL | Status: DC
  Administered 2021-11-04 – 2021-11-05 (×2): 10 mg via ORAL
  Filled 2021-11-03 (×2): qty 1

## 2021-11-03 MED ORDER — IOHEXOL 350 MG/ML SOLN
75.0000 mL | Freq: Once | INTRAVENOUS | Status: AC | PRN
  Administered 2021-11-03: 75 mL via INTRAVENOUS

## 2021-11-03 MED ORDER — STROKE: EARLY STAGES OF RECOVERY BOOK
Freq: Once | Status: AC
  Filled 2021-11-03: qty 1

## 2021-11-03 MED ORDER — CITALOPRAM HYDROBROMIDE 10 MG PO TABS: 15.0000 mg | ORAL_TABLET | Freq: Every day | ORAL | Status: DC

## 2021-11-03 MED ORDER — ACETAMINOPHEN 650 MG RE SUPP: 650.0000 mg | Freq: Four times a day (QID) | RECTAL | Status: DC | PRN

## 2021-11-03 MED ORDER — ONDANSETRON HCL 4 MG/2ML IJ SOLN: 4.0000 mg | Freq: Four times a day (QID) | INTRAMUSCULAR | Status: DC | PRN

## 2021-11-03 MED ORDER — ONDANSETRON HCL 4 MG PO TABS: 4.0000 mg | ORAL_TABLET | Freq: Four times a day (QID) | ORAL | Status: DC | PRN

## 2021-11-03 NOTE — H&P (Signed)
History and Physical    Patient: Keith Bailey MRN: [id number] DOA: 11/03/2021  Date of Service: the patient was seen and examined on 11/04/2021  Patient coming from: Home  Chief Complaint:  Chief Complaint  Patient presents with   Visual Field Change    HPI:   71 year old male with past medical history of chronic thrombocytopenia, Gilbert's syndrome, generalized anxiety disorder, diastolic congestive heart failure (Echo 04/2021 EF 60-65% with G1DD) who presents to [facility] after experiencing episode of transient visual field loss of the right eye.  Patient explains that early in the morning on 7/28 prior to breakfast the suddenly noticed that portions of his vision of his right eye work "completely black."  Patient denies any associated headache, facial droop, slurring of speech, loss of balance or focal weakness.  Patient's symptoms persisted for approximately 10 minutes before resolving spontaneously.  Patient then proceeded to be seen later that day by his ophthalmologist and underwent a dilated eye exam without any identifiable cause.  It was at this point the patient was instructed to go to the emergency department for further evaluation.  Upon evaluation in the emergency department patient remained at neurologic baseline.  Noncontrast CT imaging of the head revealed no acute disease.  ER provider discussed case with Dr. Otelia Limes with neurology who agreed to see patient in consultation and requested hospitalization for TIA work-up.  The hospitalist group was then called to assess the patient for admission to the hospital.  Review of Systems: Review of Systems  Eyes:        Visual field loss  All other systems reviewed and are negative.    Past Medical History:  Diagnosis Date   Anxiety    Gilbert's syndrome    Liver disorder    Gilbert's Syndrome-increased bilirubin level, but causes no other liver problems   Platelets decreased (HCC)    usually runs around  137-150    Past Surgical History:  Procedure Laterality Date   INGUINAL HERNIA REPAIR  05/09/2011   Procedure: HERNIA REPAIR INGUINAL ADULT;  Surgeon: Dalia Heading, MD;  Location: [facility];  Service: General;  Laterality: Right;   INGUINAL HERNIA REPAIR Left 02/17/2014   Procedure: HERNIA REPAIR INGUINAL ADULT WITH MESH;  Surgeon: Dalia Heading, MD;  Location: [facility];  Service: General;  Laterality: Left;   INSERTION OF MESH Left 02/17/2014   Procedure: INSERTION OF MESH;  Surgeon: Dalia Heading, MD;  Location: [facility];  Service: General;  Laterality: Left;    Social History:  reports that he has never smoked. He does not have any smokeless tobacco history on file. He reports current alcohol use. He reports that he does not use drugs.  No Known Allergies  Family History  Problem Relation Age of Onset   Anesthesia problems Neg Hx    Hypotension Neg Hx    Malignant hyperthermia Neg Hx    Pseudochol deficiency Neg Hx     Prior to Admission medications   Medication Sig Start Date End Date Taking? Authorizing Provider  Azelastine HCl (ASTEPRO) 0.15 % SOLN Place 2 sprays into the nose daily.    [provider]  citalopram (CELEXA) 20 MG tablet Take 15 mg by mouth daily.     [provider]  fluticasone (FLONASE) 50 MCG/ACT nasal spray Place 2 sprays into the nose daily.    [provider]  oxyCODONE-acetaminophen (PERCOCET) 7.5-325 MG per tablet Take 1-2 tablets by mouth every 4 (four) hours as needed.  02/17/14   Franky Macho, MD    Physical Exam:  Vitals:   11/03/21 2330 11/04/21 0030 11/04/21 0130 11/04/21 0525  BP: 128/89 113/66 117/80 (!) 143/86  Pulse: (!) 54 (!) 50 (!) 48 (!) 51  Resp: 20 15 17 17   Temp:    97.6 F (36.4 C)  TempSrc:    Oral  SpO2: 100% 96% 99% 99%  Weight: 75.6 kg       Constitutional: Awake alert and oriented x3, no associated distress.   Skin: no rashes, no lesions, good skin turgor noted. Eyes: Pupils are equally  reactive to light.  No evidence of scleral icterus or conjunctival pallor.  ENMT: Moist mucous membranes noted.  Posterior pharynx clear of any exudate or lesions.   Neck: normal, supple, no masses, no thyromegaly.  No evidence of jugular venous distension.   Respiratory: clear to auscultation bilaterally, no wheezing, no crackles. Normal respiratory effort. No accessory muscle use.  Cardiovascular: Regular rate and rhythm, no murmurs / rubs / gallops. No extremity edema. 2+ pedal pulses. No carotid bruits.  Chest:   Nontender without crepitus or deformity.   Back:   Nontender without crepitus or deformity. Abdomen: Abdomen is soft and nontender.  No evidence of intra-abdominal masses.  Positive bowel sounds noted in all quadrants.   Musculoskeletal: No joint deformity upper and lower extremities. Good ROM, no contractures. Normal muscle tone.  Neurologic: CN 2-12 grossly intact. Sensation intact.  Patient moving all 4 extremities spontaneously.  Patient is following all commands.  Patient is responsive to verbal stimuli.   Psychiatric: Patient exhibits normal mood with appropriate affect.  Patient seems to possess insight as to their current situation.    Data Reviewed:  I have personally reviewed and interpreted labs, imaging.  Significant findings are:  CBC revealing white blood cell count 7.7, hemoglobin 15.4, hematocrit 45.2, platelet count 151. Chemistry revealing sodium 139, potassium 4.4, chloride 105, bicarbonate 28, BUN 12, creatinine 0.84. Total bilirubin 1.5. INR 1.1.  EKG: Personally reviewed.  Rhythm is bradycardia with heart rate of 50 bpm.  Evidence of left anterior fascicular block.  No dynamic ST segment changes appreciated.  Assessment and Plan: * TIA (transient ischemic attack) No neurologically at baseline Patient saw his ophthalmologist yesterday with normal exam Performing serial neurologic checks Monitoring patient on telemetry Initiating antiplatelet therapy  including aspirin 81 mg daily Daily statin therapy will be initiated if LDL is greater than 70 Further imaging to include: CT angiogram of the head and neck, noncontrast MRI of the brain Obtaining hemoglobin A1c and lipid panel in the morning Echocardiogram in the morning with bubble study PT, OT, SLP evaluation Neurology following in consultation, their assistance is appreciated.   Chronic diastolic CHF (congestive heart failure) (HCC) No clinical evidence of cardiogenic volume overload Strict input and output monitoring Daily weights Low-sodium diet   Generalized anxiety disorder Continue home regimen of Celexa       Code Status:  Full code  code status decision has been confirmed with: patient Family Communication: deferred   Consults: Dr. with Neurology  Severity of Illness:  The appropriate patient status for this patient is OBSERVATION. Observation status is judged to be reasonable and necessary in order to provide the required intensity of service to ensure the patient's safety. The patient's presenting symptoms, physical exam findings, and initial radiographic and laboratory data in the context of their medical condition is felt to place them at decreased risk for further clinical deterioration. Furthermore, it is  anticipated that the patient will be medically stable for discharge from the hospital within 2 midnights of admission.   Author:  Marinda Elk MD  11/04/2021 6:22 AM

## 2021-11-03 NOTE — ED Notes (Signed)
Patient transported to CT 

## 2021-11-03 NOTE — ED Triage Notes (Signed)
Patient sent to ED from ophthalmologist for evaluation of possible TIA that occurred early this morning. Patients states prior to breakfast he had a ten minute episode where the top half of his right visual field became black. After vision returned to normal, patient did not notice any other deficits. Patient has no dysarthria, no vision changes except pupils are currently dilated from ophthalmology's exam, no facial droop, no arm drift, no leg drift, sensation equal right to left in face, arms, and legs. Patient is alert, oriented, and in no apparent distress at this time.

## 2021-11-03 NOTE — Consult Note (Incomplete)
NEURO HOSPITALIST CONSULT NOTE   Requestig physician: Dr. Maryan Rued  Reason for Consult:Transient altitudinal field defect of right eye  History obtained from:  Patient and Chart     HPI:                                                                                                                                          Keith Bailey is an 71 y.o. male who with a PMHx of bilateral cataract surgeries, anxiety and Gilbert's syndrome, presented to the ED from his Ophthalmologist on Friday afternoon for evaluation of possible TIA that occurred early this morning. Patient states prior to breakfast he had a ten minute episode where the top half of his right visual field became black. The episode was painless. After his vision returned to normal, the patient did not notice any other deficits. Patient has not had any weakness, numbness, dysarthria, and no vision changes except pupils were dilated from Ophthalmology's exam. He denies any temporal tenderness, headache, myalgias or jaw claudication.   Past Medical History:  Diagnosis Date   Anxiety    Gilbert's syndrome    Liver disorder    Gilbert's Syndrome-increased bilirubin level, but causes no other liver problems   Platelets decreased ([place])    usually runs around 137-150    Past Surgical History:  Procedure Laterality Date   INGUINAL HERNIA REPAIR  05/09/2011   Procedure: HERNIA REPAIR INGUINAL ADULT;  Surgeon: Jamesetta So, MD;  Location: [facility];  Service: General;  Laterality: Right;   INGUINAL HERNIA REPAIR Left 02/17/2014   Procedure: HERNIA REPAIR INGUINAL ADULT WITH MESH;  Surgeon: Jamesetta So, MD;  Location: [facility];  Service: General;  Laterality: Left;   INSERTION OF MESH Left 02/17/2014   Procedure: INSERTION OF MESH;  Surgeon: Jamesetta So, MD;  Location: [facility];  Service: General;  Laterality: Left;    Family History  Problem Relation Age of Onset   Anesthesia problems Neg Hx    Hypotension Neg  Hx    Malignant hyperthermia Neg Hx    Pseudochol deficiency Neg Hx              Social History:  reports that he has never smoked. He does not have any smokeless tobacco history on file. He reports current alcohol use. He reports that he does not use drugs.  No Known Allergies  MEDICATIONS:  Prior to Admission:  Medications Prior to Admission  Medication Sig Dispense Refill Last Dose   Azelastine HCl (ASTEPRO) 0.15 % SOLN Place 2 sprays into the nose daily.      citalopram (CELEXA) 20 MG tablet Take 15 mg by mouth daily.       fluticasone (FLONASE) 50 MCG/ACT nasal spray Place 2 sprays into the nose daily.      oxyCODONE-acetaminophen (PERCOCET) 7.5-325 MG per tablet Take 1-2 tablets by mouth every 4 (four) hours as needed. 50 tablet 0    Scheduled:   stroke: early stages of recovery book   Does not apply Once   aspirin EC  81 mg Oral Daily   atorvastatin  40 mg Oral Daily   citalopram  10 mg Oral Daily   clopidogrel  75 mg Oral Daily   enoxaparin (LOVENOX) injection  40 mg Subcutaneous Q24H   sodium chloride flush  3 mL Intravenous Once     ROS:                                                                                                                                       As per HPI. Comprehensive ROS otherwise negative.    Blood pressure 130/79, pulse (!) 53, temperature 98.7 F (37.1 C), temperature source Oral, resp. rate 12, SpO2 100 %.   General Examination:                                                                                                       Physical Exam  HEENT-  [place]/AT   Lungs- Respirations unlabored Extremities- Warm and well-perfused. No edema.   Neurological Examination Mental Status: Alert, oriented x 5, thought content appropriate.  Speech fluent without evidence of aphasia.  Able to follow all commands without  difficulty. Cranial Nerves: II: Visual fields intact. Right pupil 3 mm and reactive, left pupil 4 mm and reactive. Evidence for prior cataract surgeries bilaterally on visual inspection.  III,IV, VI: No ptosis. EOMI.  V: Temp sensation equal bilaterally  VII: Smile symmetric VIII: Hearing intact to voice IX,X: No hoarseness XI: Symmetric shoulder shrug XII: Midline tongue extension Motor: BUE 5/5 proximally and distally BLE 5/5 proximally and distally  No pronator drift.  Normal tone and bulk x 4.  Sensory: Temp and light touch intact throughout, bilaterally. No extinction to DSS.  Deep Tendon Reflexes: 2+ and symmetric brachioradialis and patellae.  Cerebellar: No ataxia with FNF bilaterally  Gait: Deferred   Lab Results:  Basic Metabolic Panel: Recent Labs  Lab 11/03/21 1656 11/03/21 1707  NA 139 139  K 4.4 4.3  CL 105 102  CO2 28  --   GLUCOSE 95 86  BUN 12 15  CREATININE 0.84 0.80  CALCIUM 9.3  --     CBC: Recent Labs  Lab 11/03/21 1656 11/03/21 1707  WBC 7.7  --   NEUTROABS 3.7  --   HGB 15.4 15.3  HCT 45.2 45.0  MCV 97.2  --   PLT 151  --     Cardiac Enzymes: No results for input(s): "CKTOTAL", "CKMB", "CKMBINDEX", "TROPONINI" in the last 168 hours.  Lipid Panel: No results for input(s): "CHOL", "TRIG", "HDL", "CHOLHDL", "VLDL", "LDLCALC" in the last 168 hours.  Imaging: CT HEAD WO CONTRAST  Result Date: 11/03/2021 CLINICAL DATA:  Temporary right eye vision loss, resolved EXAM: CT HEAD WITHOUT CONTRAST TECHNIQUE: Contiguous axial images were obtained from the base of the skull through the vertex without intravenous contrast. RADIATION DOSE REDUCTION: This exam was performed according to the departmental dose-optimization program which includes automated exposure control, adjustment of the mA and/or kV according to patient size and/or use of iterative reconstruction technique. COMPARISON:  None Available. FINDINGS: Brain: No evidence of acute infarction,  hemorrhage, hydrocephalus, extra-axial collection or mass lesion/mass effect. Vascular: No hyperdense vessel or unexpected calcification. Skull: Normal. Negative for fracture or focal lesion. Sinuses/Orbits: Partial opacification of the bilateral ethmoid sinuses. Other: None. IMPRESSION: Normal head CT. Electronically Signed   By: Julian Hy M.D.   On: 11/03/2021 17:33     Assessment: 72 year old male presenting to the ED from his [place] office for TIA evaluation after he had a ten minute episode where the top half of the visual field in his right eye became black.  1. Exam reveals asymmetric pupils in the context of prior cataract surgeries and pharmacological dilation by Ophthalmology prior to presentation.  2. CT head:  Normal head CT 3. MRI brain: No acute intracranial abnormality. Conspicuous biparietal volume loss, nonspecific but a neurodegenerative disorder or dementia could not be excluded. Otherwise normal for age noncontrast MRI appearance of the brain. Moderate bilateral paranasal sinus inflammation. 4. CTA of head and neck: Mild atheromatous change about the carotid siphons without hemodynamically significant stenosis. 5. Most likely etiology for his transient altitudinal visual field defect is embolic phenomenon, most likely from right sided carotid atherosclerosis promoting microthrombus formation with distal embolization, versus cardioembolic phenomenon.   Recommendations: 1. Start ASA and Plavix. Stop Plavix in 21 days and continue with ASA. 2. Agree with starting atorvastatin. Obtain baseline CK level.  3. HgbA1c, fasting lipid panel 4. TTE 5. Telemetry monitoring 6. Frequent neuro checks 7. Carotid ultrasound .  8. ESR and CRP 9. Stroke Team to follow  Electronically signed: Dr. Kerney Elbe 11/03/2021, 9:13 PM

## 2021-11-04 ENCOUNTER — Other Ambulatory Visit: Payer: Self-pay

## 2021-11-04 ENCOUNTER — Observation Stay (HOSPITAL_BASED_OUTPATIENT_CLINIC_OR_DEPARTMENT_OTHER): Payer: Medicare HMO

## 2021-11-04 ENCOUNTER — Other Ambulatory Visit (HOSPITAL_COMMUNITY): Payer: BLUE CROSS/BLUE SHIELD

## 2021-11-04 ENCOUNTER — Observation Stay (HOSPITAL_COMMUNITY): Payer: Medicare HMO

## 2021-11-04 DIAGNOSIS — H53121 Transient visual loss, right eye: Secondary | ICD-10-CM | POA: Diagnosis not present

## 2021-11-04 DIAGNOSIS — I5030 Unspecified diastolic (congestive) heart failure: Secondary | ICD-10-CM

## 2021-11-04 DIAGNOSIS — G459 Transient cerebral ischemic attack, unspecified: Secondary | ICD-10-CM

## 2021-11-04 DIAGNOSIS — I5032 Chronic diastolic (congestive) heart failure: Secondary | ICD-10-CM | POA: Diagnosis not present

## 2021-11-04 DIAGNOSIS — F411 Generalized anxiety disorder: Secondary | ICD-10-CM | POA: Diagnosis not present

## 2021-11-04 DIAGNOSIS — R69 Illness, unspecified: Secondary | ICD-10-CM | POA: Diagnosis not present

## 2021-11-04 DIAGNOSIS — J341 Cyst and mucocele of nose and nasal sinus: Secondary | ICD-10-CM | POA: Diagnosis not present

## 2021-11-04 DIAGNOSIS — H5461 Unqualified visual loss, right eye, normal vision left eye: Secondary | ICD-10-CM | POA: Diagnosis not present

## 2021-11-04 DIAGNOSIS — J329 Chronic sinusitis, unspecified: Secondary | ICD-10-CM | POA: Diagnosis not present

## 2021-11-04 LAB — MAGNESIUM: Magnesium: 2.2 mg/dL (ref 1.7–2.4)

## 2021-11-04 LAB — COMPREHENSIVE METABOLIC PANEL
ALT: 14 U/L (ref 0–44)
AST: 17 U/L (ref 15–41)
Albumin: 3.9 g/dL (ref 3.5–5.0)
Alkaline Phosphatase: 28 U/L — ABNORMAL LOW (ref 38–126)
Anion gap: 6 (ref 5–15)
BUN: 11 mg/dL (ref 8–23)
CO2: 27 mmol/L (ref 22–32)
Calcium: 9.1 mg/dL (ref 8.9–10.3)
Chloride: 105 mmol/L (ref 98–111)
Creatinine, Ser: 0.81 mg/dL (ref 0.61–1.24)
GFR, Estimated: >60 mL/min (ref >60)
Glucose, Bld: 101 mg/dL — ABNORMAL HIGH (ref 70–99)
Potassium: 4.1 mmol/L (ref 3.5–5.1)
Sodium: 138 mmol/L (ref 135–145)
Total Bilirubin: 2.1 mg/dL — ABNORMAL HIGH (ref 0.3–1.2)
Total Protein: 6.4 g/dL — ABNORMAL LOW (ref 6.5–8.1)

## 2021-11-04 LAB — LIPID PANEL
Cholesterol: 149 mg/dL (ref 0–200)
HDL: 68 mg/dL (ref >40)
LDL Cholesterol: 76 mg/dL (ref 0–99)
Total CHOL/HDL Ratio: 2.2 RATIO
Triglycerides: 23 mg/dL (ref <150)
VLDL: 5 mg/dL (ref 0–40)

## 2021-11-04 LAB — CBC WITH DIFFERENTIAL/PLATELET
Abs Immature Granulocytes: 0 10*3/uL (ref 0.00–0.07)
Basophils Absolute: 0 10*3/uL (ref 0.0–0.1)
Basophils Relative: 0 %
Eosinophils Absolute: 0.3 10*3/uL (ref 0.0–0.5)
Eosinophils Relative: 4 %
HCT: 45.3 % (ref 39.0–52.0)
Hemoglobin: 15.2 g/dL (ref 13.0–17.0)
Immature Granulocytes: 0 %
Lymphocytes Relative: 39 %
Lymphs Abs: 2.7 10*3/uL (ref 0.7–4.0)
MCH: 32.4 pg (ref 26.0–34.0)
MCHC: 33.6 g/dL (ref 30.0–36.0)
MCV: 96.6 fL (ref 80.0–100.0)
Monocytes Absolute: 0.6 10*3/uL (ref 0.1–1.0)
Monocytes Relative: 8 %
Neutro Abs: 3.4 10*3/uL (ref 1.7–7.7)
Neutrophils Relative %: 49 %
Platelets: 136 10*3/uL — ABNORMAL LOW (ref 150–400)
RBC: 4.69 MIL/uL (ref 4.22–5.81)
RDW: 12.2 % (ref 11.5–15.5)
WBC: 6.9 10*3/uL (ref 4.0–10.5)
nRBC: 0 % (ref 0.0–0.2)

## 2021-11-04 LAB — ECHOCARDIOGRAM COMPLETE BUBBLE STUDY
AR max vel: 3.31 cm2
AV Area VTI: 2.86 cm2
AV Area mean vel: 2.99 cm2
AV Mean grad: 2 mmHg
AV Peak grad: 4.8 mmHg
Ao pk vel: 1.09 m/s
Area-P 1/2: 2 cm2
S' Lateral: 3.3 cm

## 2021-11-04 LAB — HEMOGLOBIN A1C
Hgb A1c MFr Bld: 5.2 % (ref 4.8–5.6)
Mean Plasma Glucose: 102.54 mg/dL

## 2021-11-04 LAB — SEDIMENTATION RATE: Sed Rate: 3 mm/hr (ref 0–16)

## 2021-11-04 LAB — HIV ANTIBODY (ROUTINE TESTING W REFLEX): HIV Screen 4th Generation wRfx: NONREACTIVE

## 2021-11-04 LAB — C-REACTIVE PROTEIN: CRP: 0.5 mg/dL (ref <1.0)

## 2021-11-04 MED ORDER — ATORVASTATIN CALCIUM 40 MG PO TABS
40.0000 mg | ORAL_TABLET | Freq: Every day | ORAL | Status: DC
  Administered 2021-11-04 – 2021-11-05 (×2): 40 mg via ORAL
  Filled 2021-11-04 (×2): qty 1

## 2021-11-04 MED ORDER — OXYCODONE HCL 5 MG PO TABS: 2.5000 mg | ORAL_TABLET | ORAL | Status: DC | PRN

## 2021-11-04 MED ORDER — ASPIRIN 81 MG PO TBEC
81.0000 mg | DELAYED_RELEASE_TABLET | Freq: Every day | ORAL | Status: DC
  Administered 2021-11-04 – 2021-11-05 (×2): 81 mg via ORAL
  Filled 2021-11-04 (×2): qty 1

## 2021-11-04 MED ORDER — CLOPIDOGREL BISULFATE 75 MG PO TABS
75.0000 mg | ORAL_TABLET | Freq: Every day | ORAL | Status: DC
  Administered 2021-11-04 – 2021-11-05 (×2): 75 mg via ORAL
  Filled 2021-11-04 (×2): qty 1

## 2021-11-04 MED ORDER — OXYCODONE-ACETAMINOPHEN 7.5-325 MG PO TABS
1.0000 | ORAL_TABLET | ORAL | Status: DC | PRN
Start: 2021-11-04 — End: 2021-11-04

## 2021-11-04 MED ORDER — OXYCODONE-ACETAMINOPHEN 5-325 MG PO TABS: 1.0000 | ORAL_TABLET | ORAL | Status: DC | PRN

## 2021-11-04 NOTE — Plan of Care (Signed)
Patient understands importance of taking medications, going to follow up appointments, and calling 911 if any symptoms should occur whether they resolve or not.  Problem: Education: Goal: Knowledge of disease or condition will improve Outcome: Progressing Goal: Knowledge of secondary prevention will improve (SELECT ALL) Outcome: Progressing Goal: Knowledge of patient specific risk factors will improve (INDIVIDUALIZE FOR PATIENT) Outcome: Progressing

## 2021-11-04 NOTE — Progress Notes (Signed)
PT Cancellation Note  Patient Details Name: Keith Bailey MRN: [id number] DOB: 07-Dec-1950   Cancelled Treatment:    Reason Eval/Treat Not Completed: PT screened, no needs identified, will sign off. Pt independent. No needs per OT. Pt reports he is at his baseline.   Ilda Foil 11/04/2021, 9:35 AM

## 2021-11-04 NOTE — Progress Notes (Signed)
  Echocardiogram 2D Echocardiogram has been performed.  Roosvelt Maser F 11/04/2021, 10:40 AM

## 2021-11-04 NOTE — Assessment & Plan Note (Signed)
   Continue home regimen of Celexa

## 2021-11-04 NOTE — Assessment & Plan Note (Signed)
No clinical evidence of cardiogenic volume overload Strict input and output monitoring Daily weights Low-sodium diet  

## 2021-11-04 NOTE — Assessment & Plan Note (Addendum)
   No neurologically at baseline  Patient saw his ophthalmologist yesterday with normal exam  Performing serial neurologic checks  Monitoring patient on telemetry  Initiating antiplatelet therapy including aspirin 81 mg daily  Daily statin therapy will be initiated if LDL is greater than 70  Further imaging to include: CT angiogram of the head and neck, noncontrast MRI of the brain  Obtaining hemoglobin A1c and lipid panel in the morning  Echocardiogram in the morning with bubble study  PT, OT, SLP evaluation  Neurology following in consultation, their assistance is appreciated.

## 2021-11-04 NOTE — Evaluation (Signed)
Occupational Therapy Evaluation Patient Details Name: Keith Bailey MRN: [id number] DOB: [birth year] Today's Date: 11/04/2021   History of Present Illness Pt is a 71 y/o M presenting to ED on 7/28 from opthalmologist for evaluation of possible TIA with R visual field deficit. CT and MRI negative for aacute findings. PMH includes anxiety and liver disorder.   Clinical Impression   Pt independent at baseline with ADLs and functional mobility, lives with family who can assist at d/c. Pt reports visual symptoms have resolved, visual and cognitive assessment WFL, pt appears to be at baseline with ADLs and functional mobility, performing hallway ambulation and UB dressing independently.  Pt educated on BEFA[employer], verbalized understanding. Pt presenting with impairments listed below, however has no acute OT needs at this time, will s/o. Please reconsult if there is a change in pt status. Recommend d/c home with family assistance.     Recommendations for follow up therapy are one component of a multi-disciplinary discharge planning process, led by the attending physician.  Recommendations may be updated based on patient status, additional functional criteria and insurance authorization.   Follow Up Recommendations  No OT follow up    Assistance Recommended at Discharge PRN  Patient can return home with the following Direct supervision/assist for medications management;Direct supervision/assist for financial management;Assist for transportation    Functional Status Assessment  Patient has had a recent decline in their functional status and demonstrates the ability to make significant improvements in function in a reasonable and predictable amount of time.  Equipment Recommendations  None recommended by OT    Recommendations for Other Services       Precautions / Restrictions Restrictions Weight Bearing Restrictions: No      Mobility Bed Mobility Overal bed mobility: Modified Independent                   Transfers Overall transfer level: Modified independent                        Balance Overall balance assessment: No apparent balance deficits (not formally assessed)                                         ADL either performed or assessed with clinical judgement   ADL Overall ADL's : At baseline                                       General ADL Comments: pt performing UB dressing, and hallway ambulation without difficulty, demo's ROM to perform LB ADLs     Vision Baseline Vision/History: 1 Wears glasses Ability to See in Adequate Light: 0 Adequate Patient Visual Report: No change from baseline;Other (comment) (symptoms have resolved) Vision Assessment?: Yes Eye Alignment: Within Functional Limits Ocular Range of Motion: Within Functional Limits Alignment/Gaze Preference: Within Defined Limits Tracking/Visual Pursuits: Able to track stimulus in all quads without difficulty Saccades: Within functional limits Convergence: Within functional limits Visual Fields: No apparent deficits     Perception     Praxis      Pertinent Vitals/Pain Pain Assessment Pain Assessment: No/denies pain     Hand Dominance Right   Extremity/Trunk Assessment Upper Extremity Assessment Upper Extremity Assessment: Overall WFL for tasks assessed   Lower Extremity Assessment Lower Extremity Assessment: Overall [facility]  for tasks assessed   Cervical / Trunk Assessment Cervical / Trunk Assessment: Normal   Communication Communication Communication: No difficulties   Cognition Arousal/Alertness: Awake/alert Behavior During Therapy: WFL for tasks assessed/performed Overall Cognitive Status: Within Functional Limits for tasks assessed                                 General Comments: no errors when administering SBT     General Comments  VSS on RA    Exercises     Shoulder Instructions      Home Living  Family/patient expects to be discharged to:: Private residence Living Arrangements: Spouse/significant other Available Help at Discharge: Family;Available PRN/intermittently Type of Home: House Home Access: Level entry     Home Layout: Multi-level Alternate Level Stairs-Number of Steps: 2 flights Alternate Level Stairs-Rails: Can reach both Bathroom Shower/Tub: Tub/shower unit         Home Equipment: None          Prior Functioning/Environment Prior Level of Function : Independent/Modified Independent;Driving             Mobility Comments: no AD use ADLs Comments: ind with IADLs        OT Problem List: Impaired vision/perception      OT Treatment/Interventions:      OT Goals(Current goals can be found in the care plan section) Acute Rehab OT Goals Patient Stated Goal: none stated OT Goal Formulation: With patient Time For Goal Achievement: 11/18/21 Potential to Achieve Goals: Good  OT Frequency:      Co-evaluation              AM-PAC OT "6 Clicks" Daily Activity     Outcome Measure Help from another person eating meals?: None Help from another person taking care of personal grooming?: None Help from another person toileting, which includes using toliet, bedpan, or urinal?: None Help from another person bathing (including washing, rinsing, drying)?: None Help from another person to put on and taking off regular upper body clothing?: None Help from another person to put on and taking off regular lower body clothing?: None 6 Click Score: 24   End of Session Nurse Communication: Mobility status  Activity Tolerance: Patient tolerated treatment well Patient left: in bed;with call bell/phone within reach;with bed alarm set  OT Visit Diagnosis: Low vision, both eyes (H54.2)                Time: 3846-6599 OT Time Calculation (min): 17 min Charges:  OT General Charges $OT Visit: 1 Visit OT Evaluation $OT Eval Low Complexity: 1 Low  Alfonzo Beers,  OTD, OTR/L Acute Rehab [phone]   Keith Bailey 11/04/2021, 9:27 AM

## 2021-11-04 NOTE — Progress Notes (Addendum)
[facility] PROGRESS NOTE    Progress Note  Keith Bailey  NLZ:[id number] DOB: 01-05-1951 DOA: 11/03/2021 PCP: Juliette Alcide, MD     Brief Narrative:   Keith Bailey is an 71 y.o. male history of chronic thrombocytopenia, Gilbert disease General anxiety disorder, chronic diastolic heart failure comes into the ED for transient visual field loss on the right seen by ophthalmologist underwent eye examination without any identifiable cause was sent to the ED.  CT of the head showed no acute findings    Assessment/Plan:   TIA (transient ischemic attack): HgbA1c 5.2, fasting lipid panel LDL 72 HDL 68 MRI, showed no acute findings but today showed biparietal volume loss. CT angio of the head and neck negative for large vessel occlusion mild atheromatous changes on the carotid without hemodynamically stenosis. Neurology has been consulted recommended carotid Doppler. PT, OT, Speech consult pending Transthoracic Echo,   Start patient on ASA 81mg  daily and plavix 75mg  daily  High-dose statins. BP goal: permissive HTN upto 220/120 mmHg Telemetry monitoring no events  Chronic diastolic CHF (congestive heart failure) (HCC) Appears euvolemic and stable.  Generalized anxiety disorder Continue citalopram.    DVT prophylaxis: lovenox Family Communication:none Status is: Observation The patient remains OBS appropriate and will d/c before 2 midnights.    Code Status:     Code Status Orders  (From admission, onward)           Start     Ordered   11/03/21 2249  Full code  Continuous        11/03/21 2320           Code Status History     This patient has a current code status but no historical code status.         IV Access:   Peripheral IV   Procedures and diagnostic studies:   MR BRAIN WO CONTRAST  Result Date: 11/04/2021 CLINICAL DATA:  71 year old male with visual disturbance, transient right eye vision loss, TIA. EXAM: MRI HEAD WITHOUT  CONTRAST TECHNIQUE: Multiplanar, multiecho pulse sequences of the brain and surrounding structures were obtained without intravenous contrast. COMPARISON:  CT head and CTA head and neck yesterday. FINDINGS: Brain: No restricted diffusion to suggest acute infarction. No midline shift, mass effect, evidence of mass lesion, ventriculomegaly, extra-axial collection or acute intracranial hemorrhage. Cervicomedullary junction and pituitary are within normal limits. Mega cisterna magna, normal variant. Disproportionate volume loss of the parietal lobes (series 11, image 16), fairly symmetric. Elsewhere brain volume appears preserved for age. No cortical encephalomalacia or chronic cerebral blood products identified. 11/06/2021 and white matter signal is within normal limits for age throughout the brain. Vascular: Major intracranial vascular flow voids are preserved. Skull and upper cervical spine: Negative visible cervical spine. Visualized bone marrow signal is within normal limits. Sinuses/Orbits: Suprasellar cistern and optic chiasm appear unremarkable. Chronic postoperative changes to both globes. Orbits soft tissues otherwise negative. Scattered bilateral paranasal sinus opacification and mucosal thickening with some retention cysts. Other: Mastoids are clear. Visible internal auditory structures appear normal. Negative visible scalp and face. IMPRESSION: 1. No acute intracranial abnormality. 2. Conspicuous biparietal volume loss, nonspecific but a neurodegenerative disorder or dementia could not be excluded. But otherwise normal for age noncontrast MRI appearance of the Brain. 3. Moderate bilateral paranasal sinus inflammation. Electronically Signed   By: 80 M.D.   On: 11/04/2021 05:39   CT ANGIO HEAD NECK W WO CM  Result Date: 11/03/2021 CLINICAL DATA:  Initial evaluation for neuro  deficit, stroke suspected. EXAM: CT ANGIOGRAPHY HEAD AND NECK TECHNIQUE: Multidetector CT imaging of the head and neck was performed  using the standard protocol during bolus administration of intravenous contrast. Multiplanar CT image reconstructions and MIPs were obtained to evaluate the vascular anatomy. Carotid stenosis measurements (when applicable) are obtained utilizing NASCET criteria, using the distal internal carotid diameter as the denominator. RADIATION DOSE REDUCTION: This exam was performed according to the departmental dose-optimization program which includes automated exposure control, adjustment of the mA and/or kV according to patient size and/or use of iterative reconstruction technique. CONTRAST:  77mL OMNIPAQUE IOHEXOL 350 MG/ML SOLN COMPARISON:  Head CT from earlier the same day. FINDINGS: CTA NECK FINDINGS Aortic arch: Visualized aortic arch normal in caliber. Origin of the great vessels incompletely visualized on this exam. No visible stenosis about the great vessels. Right carotid system: Right common and internal carotid arteries widely patent without stenosis, dissection or occlusion. Left carotid system: Left common and internal carotid arteries widely patent without stenosis, dissection or occlusion. Vertebral arteries: Both vertebral arteries arise from the subclavian arteries. No proximal subclavian artery stenosis. Both vertebral arteries widely patent without stenosis, dissection or occlusion. Skeleton: No discrete or worrisome osseous lesions. Other neck: No other acute soft tissue abnormality within the neck. Left submandibular gland is hypoplastic and/or absent. Chronic right maxillary sinusitis. Upper chest: Visualized upper chest demonstrates no acute finding. Review of the MIP images confirms the above findings CTA HEAD FINDINGS Anterior circulation: Petrous segments patent bilaterally. Atheromatous change within the carotid siphons without hemodynamically significant stenosis. A1 segments patent bilaterally. Normal anterior communicating artery complex. Anterior cerebral arteries patent without stenosis. No  M1 stenosis or occlusion. No proximal MCA branch occlusion. Distal MCA branches perfused and symmetric. Posterior circulation: Both vertebral arteries patent without stenosis. Both PICA patent. Basilar patent to its distal aspect without stenosis. Left PCA primarily supplied via the basilar. Right PCA supplied via a hypoplastic right P1 segment and robust right posterior communicating artery. Both PCAs patent to their distal aspects without stenosis. Venous sinuses: Patent allowing for timing the contrast bolus. Anatomic variants: As above. No aneurysm. No abnormal enhancement on delayed sequence. Review of the MIP images confirms the above findings IMPRESSION: 1. Negative CTA for large vessel occlusion or other emergent finding. 2. Mild atheromatous change about the carotid siphons without hemodynamically significant stenosis. 3. Chronic right maxillary sinusitis. Electronically Signed   By: Jeannine Boga M.D.   On: 11/03/2021 23:48   CT HEAD WO CONTRAST  Result Date: 11/03/2021 CLINICAL DATA:  Temporary right eye vision loss, resolved EXAM: CT HEAD WITHOUT CONTRAST TECHNIQUE: Contiguous axial images were obtained from the base of the skull through the vertex without intravenous contrast. RADIATION DOSE REDUCTION: This exam was performed according to the departmental dose-optimization program which includes automated exposure control, adjustment of the mA and/or kV according to patient size and/or use of iterative reconstruction technique. COMPARISON:  None Available. FINDINGS: Brain: No evidence of acute infarction, hemorrhage, hydrocephalus, extra-axial collection or mass lesion/mass effect. Vascular: No hyperdense vessel or unexpected calcification. Skull: Normal. Negative for fracture or focal lesion. Sinuses/Orbits: Partial opacification of the bilateral ethmoid sinuses. Other: None. IMPRESSION: Normal head CT. Electronically Signed   By: Julian Hy M.D.   On: 11/03/2021 17:33     Medical  Consultants:   None.   Subjective:    Keith Bailey relates his symptoms are resolved.  Objective:    Vitals:   11/04/21 0130 11/04/21 0230 11/04/21 0330 11/04/21 ID:[id number]  BP: 117/80 (!) 124/99 129/84 (!) 143/86  Pulse: (!) 48 (!) 46 (!) 44 (!) 51  Resp: 17   17  Temp:    97.6 F (36.4 C)  TempSrc:    Oral  SpO2: 99% 98% 97% 99%  Weight:       SpO2: 99 %  No intake or output data in the 24 hours ending 11/04/21 0640 Filed Weights   11/03/21 2330  Weight: 75.6 kg    Exam: General exam: In no acute distress. Respiratory system: Good air movement and clear to auscultation. Cardiovascular system: S1 & S2 heard, RRR. No JVD.  Gastrointestinal system: Abdomen is nondistended, soft and nontender.  Extremities: No pedal edema. Skin: No rashes, lesions or ulcers Psychiatry: Judgement and insight appear normal. Mood & affect appropriate.    Data Reviewed:    Labs: Basic Metabolic Panel: Recent Labs  Lab 11/03/21 1656 11/03/21 1707 11/04/21 0327  NA 139 139 138  K 4.4 4.3 4.1  CL 105 102 105  CO2 28  --  27  GLUCOSE 95 86 101*  BUN 12 15 11   CREATININE 0.84 0.80 0.81  CALCIUM 9.3  --  9.1  MG  --   --  2.2   GFR CrCl cannot be calculated (Unknown ideal weight.). Liver Function Tests: Recent Labs  Lab 11/03/21 1656 11/04/21 0327  AST 22 17  ALT 15 14  ALKPHOS 30* 28*  BILITOT 1.5* 2.1*  PROT 7.1 6.4*  ALBUMIN 4.3 3.9   No results for input(s): "LIPASE", "AMYLASE" in the last 168 hours. No results for input(s): "AMMONIA" in the last 168 hours. Coagulation profile Recent Labs  Lab 11/03/21 1656  INR 1.1   COVID-19 Labs  No results for input(s): "DDIMER", "FERRITIN", "LDH", "CRP" in the last 72 hours.  No results found for: "SARSCOV2NAA"  CBC: Recent Labs  Lab 11/03/21 1656 11/03/21 1707 11/04/21 0327  WBC 7.7  --  6.9  NEUTROABS 3.7  --  3.4  HGB 15.4 15.3 15.2  HCT 45.2 45.0 45.3  MCV 97.2  --  96.6  PLT 151  --  136*   Cardiac  Enzymes: No results for input(s): "CKTOTAL", "CKMB", "CKMBINDEX", "TROPONINI" in the last 168 hours. BNP (last 3 results) No results for input(s): "PROBNP" in the last 8760 hours. CBG: No results for input(s): "GLUCAP" in the last 168 hours. D-Dimer: No results for input(s): "DDIMER" in the last 72 hours. Hgb A1c: Recent Labs    11/04/21 0327  HGBA1C 5.2   Lipid Profile: Recent Labs    11/04/21 0327  CHOL 149  HDL 68  LDLCALC 76  TRIG 23  CHOLHDL 2.2   Thyroid function studies: No results for input(s): "TSH", "T4TOTAL", "T3FREE", "THYROIDAB" in the last 72 hours.  Invalid input(s): "FREET3" Anemia work up: No results for input(s): "VITAMINB12", "FOLATE", "FERRITIN", "TIBC", "IRON", "RETICCTPCT" in the last 72 hours. Sepsis Labs: Recent Labs  Lab 11/03/21 1656 11/04/21 0327  WBC 7.7 6.9   Microbiology No results found for this or any previous visit (from the past 240 hour(s)).   Medications:     stroke: early stages of recovery book   Does not apply Once   aspirin EC  81 mg Oral Daily   atorvastatin  40 mg Oral Daily   citalopram  10 mg Oral Daily   enoxaparin (LOVENOX) injection  40 mg Subcutaneous Q24H   sodium chloride flush  3 mL Intravenous Once   Continuous Infusions:    LOS: 0  days   Marinda Elk  [facility]  11/04/2021, 6:40 AM

## 2021-11-04 NOTE — ED Provider Notes (Signed)
[facility] PROGRESSIVE CARE Provider Note   CSN: [id number] Arrival date & time: 11/03/21  1649     History  Chief Complaint  Patient presents with   Visual Field Change    Keith Bailey is a 71 y.o. male.  Patient is a healthy 71 year old male with no significant past medical history who is presenting today due to concern for possible TIA.  Patient reports that he was at home around noon today and suddenly he lost the upper part of his vision in his right eye that lasted approximately 10 minutes.  He reports that when he covered his right eye the vision in his left eye was completely normal but when both eyes were open he felt a little disoriented because of the vision change.  He had no facial droop, speech abnormality, unilateral weakness or numbness.  He had no eye pain or headache during this event.  He reports symptoms have completely resolved.  He went and saw his ophthalmologist who did a complete exam and reported that there was no acute findings and sent him to the emergency room for further evaluation.  Patient reports he has never had anything like this before but did note a few months ago he had a brief episode of double vision that resolved and has never returned.  He denied any double vision today.  There was no dizziness, nausea or vomiting.  He denies any recent trauma.  He has no pain over his temple.  The history is provided by the patient and the spouse.       Home Medications Prior to Admission medications   Medication Sig Start Date End Date Taking? Authorizing Provider  Azelastine HCl (ASTEPRO) 0.15 % SOLN Place 2 sprays into the nose daily.    [provider]  citalopram (CELEXA) 20 MG tablet Take 15 mg by mouth daily.     [provider]  fluticasone (FLONASE) 50 MCG/ACT nasal spray Place 2 sprays into the nose daily.    [provider]  oxyCODONE-acetaminophen (PERCOCET) 7.5-325 MG per tablet Take 1-2 tablets by mouth every 4  (four) hours as needed. 02/17/14   Franky Macho, MD      Allergies    Patient has no known allergies.    Review of Systems   Review of Systems  Physical Exam Updated Vital Signs BP 128/89   Pulse (!) 54   Temp 98.5 F (36.9 C) (Oral)   Resp 20   SpO2 100%  Physical Exam Vitals and nursing note reviewed.  Constitutional:      General: He is not in acute distress.    Appearance: He is well-developed.  HENT:     Head: Normocephalic and atraumatic.     Comments: No temporal pain with palpation Eyes:     General: No visual field deficit.    Extraocular Movements: Extraocular movements intact.     Conjunctiva/sclera: Conjunctivae normal.     Pupils: Pupils are equal, round, and reactive to light.  Cardiovascular:     Rate and Rhythm: Normal rate and regular rhythm.     Heart sounds: No murmur heard. Pulmonary:     Effort: Pulmonary effort is normal. No respiratory distress.     Breath sounds: Normal breath sounds. No wheezing or rales.  Abdominal:     General: There is no distension.     Palpations: Abdomen is soft.     Tenderness: There is no abdominal tenderness. There is no guarding or rebound.  Musculoskeletal:  General: No tenderness. Normal range of motion.     Cervical back: Normal range of motion and neck supple.  Skin:    General: Skin is warm and dry.     Findings: No erythema or rash.  Neurological:     Mental Status: He is alert and oriented to person, place, and time. Mental status is at baseline.     Cranial Nerves: No cranial nerve deficit, dysarthria or facial asymmetry.     Sensory: No sensory deficit.     Motor: No weakness or pronator drift.     Coordination: Coordination is intact. Coordination normal.     Gait: Gait is intact. Gait normal.  Psychiatric:        Mood and Affect: Mood normal.        Behavior: Behavior normal.     ED Results / Procedures / Treatments   Labs (all labs ordered are listed, but only abnormal results are  displayed) Labs Reviewed  COMPREHENSIVE METABOLIC PANEL - Abnormal; Notable for the following components:      Result Value   Alkaline Phosphatase 30 (*)    Total Bilirubin 1.5 (*)    All other components within normal limits  I-STAT CHEM 8, ED - Abnormal; Notable for the following components:   Calcium, Ion 1.10 (*)    All other components within normal limits  PROTIME-INR  APTT  CBC  DIFFERENTIAL  ETHANOL  LIPID PANEL  COMPREHENSIVE METABOLIC PANEL  MAGNESIUM  CBC WITH DIFFERENTIAL/PLATELET  HEMOGLOBIN A1C  HIV ANTIBODY (ROUTINE TESTING W REFLEX)    EKG EKG Interpretation  Date/Time:  Friday November 03 2021 16:58:36 EDT Ventricular Rate:  50 PR Interval:  188 QRS Duration: 92 QT Interval:  440 QTC Calculation: 401 R Axis:   -66 Text Interpretation: Sinus bradycardia Left anterior fascicular block When compared with ECG of 11-Feb-2014 10:42, PREVIOUS ECG IS PRESENT No significant change since last tracing Confirmed by Gwyneth Sprout ([id number]) on 11/03/2021 8:08:54 PM  Radiology CT ANGIO HEAD NECK W WO CM  Result Date: 11/03/2021 CLINICAL DATA:  Initial evaluation for neuro deficit, stroke suspected. EXAM: CT ANGIOGRAPHY HEAD AND NECK TECHNIQUE: Multidetector CT imaging of the head and neck was performed using the standard protocol during bolus administration of intravenous contrast. Multiplanar CT image reconstructions and MIPs were obtained to evaluate the vascular anatomy. Carotid stenosis measurements (when applicable) are obtained utilizing NASCET criteria, using the distal internal carotid diameter as the denominator. RADIATION DOSE REDUCTION: This exam was performed according to the departmental dose-optimization program which includes automated exposure control, adjustment of the mA and/or kV according to patient size and/or use of iterative reconstruction technique. CONTRAST:  31mL OMNIPAQUE IOHEXOL 350 MG/ML SOLN COMPARISON:  Head CT from earlier the same day. FINDINGS:  CTA NECK FINDINGS Aortic arch: Visualized aortic arch normal in caliber. Origin of the great vessels incompletely visualized on this exam. No visible stenosis about the great vessels. Right carotid system: Right common and internal carotid arteries widely patent without stenosis, dissection or occlusion. Left carotid system: Left common and internal carotid arteries widely patent without stenosis, dissection or occlusion. Vertebral arteries: Both vertebral arteries arise from the subclavian arteries. No proximal subclavian artery stenosis. Both vertebral arteries widely patent without stenosis, dissection or occlusion. Skeleton: No discrete or worrisome osseous lesions. Other neck: No other acute soft tissue abnormality within the neck. Left submandibular gland is hypoplastic and/or absent. Chronic right maxillary sinusitis. Upper chest: Visualized upper chest demonstrates no acute finding. Review of the MIP  images confirms the above findings CTA HEAD FINDINGS Anterior circulation: Petrous segments patent bilaterally. Atheromatous change within the carotid siphons without hemodynamically significant stenosis. A1 segments patent bilaterally. Normal anterior communicating artery complex. Anterior cerebral arteries patent without stenosis. No M1 stenosis or occlusion. No proximal MCA branch occlusion. Distal MCA branches perfused and symmetric. Posterior circulation: Both vertebral arteries patent without stenosis. Both PICA patent. Basilar patent to its distal aspect without stenosis. Left PCA primarily supplied via the basilar. Right PCA supplied via a hypoplastic right P1 segment and robust right posterior communicating artery. Both PCAs patent to their distal aspects without stenosis. Venous sinuses: Patent allowing for timing the contrast bolus. Anatomic variants: As above. No aneurysm. No abnormal enhancement on delayed sequence. Review of the MIP images confirms the above findings IMPRESSION: 1. Negative CTA for  large vessel occlusion or other emergent finding. 2. Mild atheromatous change about the carotid siphons without hemodynamically significant stenosis. 3. Chronic right maxillary sinusitis. Electronically Signed   By: Rise Mu M.D.   On: 11/03/2021 23:48   CT HEAD WO CONTRAST  Result Date: 11/03/2021 CLINICAL DATA:  Temporary right eye vision loss, resolved EXAM: CT HEAD WITHOUT CONTRAST TECHNIQUE: Contiguous axial images were obtained from the base of the skull through the vertex without intravenous contrast. RADIATION DOSE REDUCTION: This exam was performed according to the departmental dose-optimization program which includes automated exposure control, adjustment of the mA and/or kV according to patient size and/or use of iterative reconstruction technique. COMPARISON:  None Available. FINDINGS: Brain: No evidence of acute infarction, hemorrhage, hydrocephalus, extra-axial collection or mass lesion/mass effect. Vascular: No hyperdense vessel or unexpected calcification. Skull: Normal. Negative for fracture or focal lesion. Sinuses/Orbits: Partial opacification of the bilateral ethmoid sinuses. Other: None. IMPRESSION: Normal head CT. Electronically Signed   By: Charline Bills M.D.   On: 11/03/2021 17:33    Procedures Procedures    Medications Ordered in ED Medications  sodium chloride flush (NS) 0.9 % injection 3 mL (has no administration in time range)   stroke: early stages of recovery book (has no administration in time range)  enoxaparin (LOVENOX) injection 40 mg (has no administration in time range)  acetaminophen (TYLENOL) tablet 650 mg (has no administration in time range)    Or  acetaminophen (TYLENOL) suppository 650 mg (has no administration in time range)  polyethylene glycol (MIRALAX / GLYCOLAX) packet 17 g (has no administration in time range)  ondansetron (ZOFRAN) tablet 4 mg (has no administration in time range)    Or  ondansetron (ZOFRAN) injection 4 mg (has no  administration in time range)  citalopram (CELEXA) tablet 10 mg (has no administration in time range)  iohexol (OMNIPAQUE) 350 MG/ML injection 75 mL (75 mLs Intravenous Contrast Given 11/03/21 2312)    ED Course/ Medical Decision Making/ A&P                           Medical Decision Making Amount and/or Complexity of Data Reviewed External Data Reviewed: notes.    Details: Ophthalmology Labs: ordered. Decision-making details documented in ED Course. Radiology: ordered and independent interpretation performed. Decision-making details documented in ED Course. ECG/medicine tests: ordered and independent interpretation performed. Decision-making details documented in ED Course.  Risk Prescription drug management. Decision regarding hospitalization.  Pt with multiple medical problems and comorbidities and presenting today with a complaint that caries a high risk for morbidity and mortality.  Today with symptoms earlier that was concerning for possible TIA.  Patient had right-sided hemianopia.  It is resolved now.  He has already seen ophthalmology and everything with his eyes normal without evidence of retinal tears or vitreous hemorrhage.  Patient has no eye pain or headache and low suspicion for migraine.  He has no temporal artery tenderness concerning for temporal arteritis.  At this time neurologic exam is normal.  I independently interpreted patient's labs and EKG and CMP, INR, CBC are all within normal limits.  EKG with no acute findings.  I have independently visualized and interpreted pt's images today.  Head CT without acute findings.  Consulted with neurology with Dr. Otelia Limes.  At this time he feels that patient needs a TIA work-up and admission.  CTA of the head and neck were ordered as well as an MRI.  Consulted with hospitalist for admission and further work-up.  Findings were discussed with the patient and his wife.  They are comfortable with this plan.           Final  Clinical Impression(s) / ED Diagnoses Final diagnoses:  TIA (transient ischemic attack)    Rx / DC Orders ED Discharge Orders     None         Gwyneth Sprout, MD 11/04/21 0020

## 2021-11-04 NOTE — Progress Notes (Addendum)
STROKE TEAM PROGRESS NOTE   INTERVAL HISTORY He is doing well this morning.  He has no complaints.  His visual deficit has resolved.  No weakness no numbness.  No history of stroke that he can recall.  Vitals:   11/04/21 0330 11/04/21 0525 11/04/21 0724 11/04/21 0750  BP: 129/84 (!) 143/86 138/82   Pulse: (!) 44 (!) 51 (!) 48 (!) 48  Resp:  17 15 15   Temp:  97.6 F (36.4 C) 97.6 F (36.4 C)   TempSrc:  Oral Oral   SpO2: 97% 99% 100% 100%  Weight:       CBC:  Recent Labs  Lab 11/03/21 1656 11/03/21 1707 11/04/21 0327  WBC 7.7  --  6.9  NEUTROABS 3.7  --  3.4  HGB 15.4 15.3 15.2  HCT 45.2 45.0 45.3  MCV 97.2  --  96.6  PLT 151  --  409*   Basic Metabolic Panel:  Recent Labs  Lab 11/03/21 1656 11/03/21 1707 11/04/21 0327  NA 139 139 138  K 4.4 4.3 4.1  CL 105 102 105  CO2 28  --  27  GLUCOSE 95 86 101*  BUN 12 15 11   CREATININE 0.84 0.80 0.81  CALCIUM 9.3  --  9.1  MG  --   --  2.2   Lipid Panel:  Recent Labs  Lab 11/04/21 0327  CHOL 149  TRIG 23  HDL 68  CHOLHDL 2.2  VLDL 5  LDLCALC 76   HgbA1c:  Recent Labs  Lab 11/04/21 0327  HGBA1C 5.2   Urine Drug Screen: No results for input(s): "LABOPIA", "COCAINSCRNUR", "LABBENZ", "AMPHETMU", "THCU", "LABBARB" in the last 168 hours.  Alcohol Level  Recent Labs  Lab 11/03/21 1656  ETH <10    IMAGING past 24 hours MR BRAIN WO CONTRAST  Result Date: 11/04/2021 CLINICAL DATA:  71 year old male with visual disturbance, transient right eye vision loss, TIA. EXAM: MRI HEAD WITHOUT CONTRAST TECHNIQUE: Multiplanar, multiecho pulse sequences of the brain and surrounding structures were obtained without intravenous contrast. COMPARISON:  CT head and CTA head and neck yesterday. FINDINGS: Brain: No restricted diffusion to suggest acute infarction. No midline shift, mass effect, evidence of mass lesion, ventriculomegaly, extra-axial collection or acute intracranial hemorrhage. Cervicomedullary junction and pituitary  are within normal limits. Mega cisterna magna, normal variant. Disproportionate volume loss of the parietal lobes (series 11, image 16), fairly symmetric. Elsewhere brain volume appears preserved for age. No cortical encephalomalacia or chronic cerebral blood products identified. Pearline Cables and white matter signal is within normal limits for age throughout the brain. Vascular: Major intracranial vascular flow voids are preserved. Skull and upper cervical spine: Negative visible cervical spine. Visualized bone marrow signal is within normal limits. Sinuses/Orbits: Suprasellar cistern and optic chiasm appear unremarkable. Chronic postoperative changes to both globes. Orbits soft tissues otherwise negative. Scattered bilateral paranasal sinus opacification and mucosal thickening with some retention cysts. Other: Mastoids are clear. Visible internal auditory structures appear normal. Negative visible scalp and face. IMPRESSION: 1. No acute intracranial abnormality. 2. Conspicuous biparietal volume loss, nonspecific but a neurodegenerative disorder or dementia could not be excluded. But otherwise normal for age noncontrast MRI appearance of the Brain. 3. Moderate bilateral paranasal sinus inflammation. Electronically Signed   By: Genevie Ann M.D.   On: 11/04/2021 05:39   CT ANGIO HEAD NECK W WO CM  Result Date: 11/03/2021 CLINICAL DATA:  Initial evaluation for neuro deficit, stroke suspected. EXAM: CT ANGIOGRAPHY HEAD AND NECK TECHNIQUE: Multidetector CT imaging of  the head and neck was performed using the standard protocol during bolus administration of intravenous contrast. Multiplanar CT image reconstructions and MIPs were obtained to evaluate the vascular anatomy. Carotid stenosis measurements (when applicable) are obtained utilizing NASCET criteria, using the distal internal carotid diameter as the denominator. RADIATION DOSE REDUCTION: This exam was performed according to the departmental dose-optimization program which  includes automated exposure control, adjustment of the mA and/or kV according to patient size and/or use of iterative reconstruction technique. CONTRAST:  32m OMNIPAQUE IOHEXOL 350 MG/ML SOLN COMPARISON:  Head CT from earlier the same day. FINDINGS: CTA NECK FINDINGS Aortic arch: Visualized aortic arch normal in caliber. Origin of the great vessels incompletely visualized on this exam. No visible stenosis about the great vessels. Right carotid system: Right common and internal carotid arteries widely patent without stenosis, dissection or occlusion. Left carotid system: Left common and internal carotid arteries widely patent without stenosis, dissection or occlusion. Vertebral arteries: Both vertebral arteries arise from the subclavian arteries. No proximal subclavian artery stenosis. Both vertebral arteries widely patent without stenosis, dissection or occlusion. Skeleton: No discrete or worrisome osseous lesions. Other neck: No other acute soft tissue abnormality within the neck. Left submandibular gland is hypoplastic and/or absent. Chronic right maxillary sinusitis. Upper chest: Visualized upper chest demonstrates no acute finding. Review of the MIP images confirms the above findings CTA HEAD FINDINGS Anterior circulation: Petrous segments patent bilaterally. Atheromatous change within the carotid siphons without hemodynamically significant stenosis. A1 segments patent bilaterally. Normal anterior communicating artery complex. Anterior cerebral arteries patent without stenosis. No M1 stenosis or occlusion. No proximal MCA branch occlusion. Distal MCA branches perfused and symmetric. Posterior circulation: Both vertebral arteries patent without stenosis. Both PICA patent. Basilar patent to its distal aspect without stenosis. Left PCA primarily supplied via the basilar. Right PCA supplied via a hypoplastic right P1 segment and robust right posterior communicating artery. Both PCAs patent to their distal aspects  without stenosis. Venous sinuses: Patent allowing for timing the contrast bolus. Anatomic variants: As above. No aneurysm. No abnormal enhancement on delayed sequence. Review of the MIP images confirms the above findings IMPRESSION: 1. Negative CTA for large vessel occlusion or other emergent finding. 2. Mild atheromatous change about the carotid siphons without hemodynamically significant stenosis. 3. Chronic right maxillary sinusitis. Electronically Signed   By: BJeannine BogaM.D.   On: 11/03/2021 23:48   CT HEAD WO CONTRAST  Result Date: 11/03/2021 CLINICAL DATA:  Temporary right eye vision loss, resolved EXAM: CT HEAD WITHOUT CONTRAST TECHNIQUE: Contiguous axial images were obtained from the base of the skull through the vertex without intravenous contrast. RADIATION DOSE REDUCTION: This exam was performed according to the departmental dose-optimization program which includes automated exposure control, adjustment of the mA and/or kV according to patient size and/or use of iterative reconstruction technique. COMPARISON:  None Available. FINDINGS: Brain: No evidence of acute infarction, hemorrhage, hydrocephalus, extra-axial collection or mass lesion/mass effect. Vascular: No hyperdense vessel or unexpected calcification. Skull: Normal. Negative for fracture or focal lesion. Sinuses/Orbits: Partial opacification of the bilateral ethmoid sinuses. Other: None. IMPRESSION: Normal head CT. Electronically Signed   By: SJulian HyM.D.   On: 11/03/2021 17:33    PHYSICAL EXAM Mental Status: Alert, oriented x 5, thought content appropriate.  Speech fluent without evidence of aphasia.  Able to follow all commands without difficulty. Cranial Nerves: II: Visual fields intact. Right pupil 3 mm and reactive, left pupil 4 mm and reactive.  III,IV, VI: No ptosis. EOMI.  V:  Temp sensation equal bilaterally  VII: Smile symmetric VIII: Hearing intact to voice IX,X: No hoarseness XI: Symmetric shoulder  shrug XII: Midline tongue extension Motor: BUE 5/5 proximally and distally BLE 5/5 proximally and distally  Sensory: Temp and light touch intact throughout, bilaterally. No extinction to DSS.  Deep Tendon Reflexes: 2+ and symmetric brachioradialis and patellae.  Cerebellar: No ataxia with FNF bilaterally  Gait: Deferred   NIHSS: 0  ASSESSMENT/PLAN Mr. Keith Bailey is a 71 y.o. male with history of chronic thrombocytopenia, Gilbert's syndrome, generalized anxiety disorder, diastolic congestive heart failure (Echo 04/2021 EF 60-65% with G1DD presenting from his ophthalmologist office after a ten minute episode of the top half of his visual field in his right eye became black.   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  CTA head & neck Mild atheromatous change about the carotid siphons without hemodynamically significant stenosis.  MRI  no acute abnormality 2D Echo EF 60-65%. No PFO. Sl. Enlarged aortic root. ESR-normal. CRP-normal. LDL 76 HgbA1c 5.2 VTE prophylaxis -lovenox.    Diet   Diet Heart Room service appropriate? Yes; Fluid consistency: Thin   No antithrombotic prior to admission, now on aspirin 81 mg daily and clopidogrel 75 mg daily for 21 days and then ASA 38m alone.  Therapy recommendations:  none. Disposition:  home.  Hypertension Permissive hypertension (OK if < 220/120) but gradually normalize in 5-7 days Long-term BP goal normotensive  Hyperlipidemia LDL 76, goal < 70 Continue statin at discharge  Diabetes type II Controlled  HgbA1c 5.2, goal < 7.0 CBGs No results for input(s): "GLUCAP" in the last 72 hours.  [facility]  Exam is unremarkable.  Echo and labs negative.  He is on a statin.  DAPT therapy for 3 weeks and aspirin alone.   follow-up in stroke clinic after discharge.  Neurology will sign off call with questions.   Total of 35 mins spent reviewing chart,  discussion with patient and family on prognosis, Dx and plan. Discussed case with patient's nurse. Reviewed Imaging personally.   To contact Stroke Continuity provider, please refer to [email or web address] After hours, contact General Neurology

## 2021-11-04 NOTE — Progress Notes (Signed)
SLP Cancellation Note  Patient Details Name: Keith Bailey MRN: [id number] DOB: 1950-05-26   Cancelled treatment:       Reason Eval/Treat Not Completed: SLP screened, no needs identified, will sign off   Angela Nevin, MA, CCC-SLP Speech Therapy

## 2021-11-04 NOTE — Progress Notes (Signed)
Carotid duplex bilateral study completed.   Please see CV Proc for preliminary results.   Olita Takeshita, RDMS, RVT  

## 2021-11-04 NOTE — Care Management Obs Status (Signed)
MEDICARE OBSERVATION STATUS NOTIFICATION   Patient Details  Name: Keith Bailey MRN: [id number] Date of Birth: 1950/10/02   Medicare Observation Status Notification Given:  Yes    Lawerance Sabal, RN 11/04/2021, 2:52 PM

## 2021-11-05 DIAGNOSIS — G459 Transient cerebral ischemic attack, unspecified: Secondary | ICD-10-CM | POA: Diagnosis not present

## 2021-11-05 DIAGNOSIS — I5032 Chronic diastolic (congestive) heart failure: Secondary | ICD-10-CM | POA: Diagnosis not present

## 2021-11-05 DIAGNOSIS — F411 Generalized anxiety disorder: Secondary | ICD-10-CM | POA: Diagnosis not present

## 2021-11-05 DIAGNOSIS — R69 Illness, unspecified: Secondary | ICD-10-CM | POA: Diagnosis not present

## 2021-11-05 MED ORDER — ASPIRIN 81 MG PO TBEC
81.0000 mg | DELAYED_RELEASE_TABLET | Freq: Every day | ORAL | 12 refills | Status: AC
Start: 2021-11-05 — End: ?

## 2021-11-05 MED ORDER — CLOPIDOGREL BISULFATE 75 MG PO TABS: 75.0000 mg | ORAL_TABLET | Freq: Every day | ORAL | 0 refills | Status: AC

## 2021-11-05 MED ORDER — ATORVASTATIN CALCIUM 40 MG PO TABS
40.0000 mg | ORAL_TABLET | Freq: Every day | ORAL | 0 refills | Status: AC
Start: 2021-11-05 — End: ?

## 2021-11-05 NOTE — Plan of Care (Signed)
Patient able to verbalize all the appropriate things to lessen the likelihood of another TIA/stroke, symptoms of a stroke and when to call 911, follow medication recommendations, go to all follow up appts.  Problem: Education: Goal: Knowledge of disease or condition will improve Outcome: Adequate for Discharge Goal: Knowledge of secondary prevention will improve (SELECT ALL) Outcome: Adequate for Discharge Goal: Knowledge of patient specific risk factors will improve (INDIVIDUALIZE FOR PATIENT) Outcome: Adequate for Discharge

## 2021-11-05 NOTE — Discharge Summary (Signed)
Physician Discharge Summary  Keith Bailey WUJ:[id number] DOB: 1951-04-08 DOA: 11/03/2021  PCP: Juliette AlcideBurdine, Steven E, MD  Admit date: 11/03/2021 Discharge date: 11/05/2021  Admitted From: Home Disposition:  Home  Recommendations for Outpatient Follow-up:  Follow up with PCP in 1-2 weeks Please obtain BMP/CBC in one week   Home Health:No Equipment/Devices:None  Discharge Condition:Stable CODE STATUS:Full Diet recommendation: Heart Healthy   Brief/Interim Summary:  71 y.o. male history of chronic thrombocytopenia, Gilbert disease General anxiety disorder, chronic diastolic heart failure comes into the ED for transient visual field loss on the right seen by ophthalmologist underwent eye examination without any identifiable cause was sent to the ED.  CT of the head showed no acute findings  Discharge Diagnoses:  Principal Problem:   TIA (transient ischemic attack) Active Problems:   Chronic diastolic CHF (congestive heart failure) (HCC)   Generalized anxiety disorder  TIA: With an A1c of 5.2 fasting lipid panel LDL 72 HDL 68. MRI of the head showed no acute findings. CT angio of head and neck showed no large vessel occlusion or acute abnormalities. Neurology was consulted recommended carotid Doppler that showed no significant stenosis. 2D echo was done that showed an EF of 60% grade 1 diastolic heart failure and bubble study was negative. He was started on aspirin and Plavix he will continue aspirin and Plavix for 3 weeks then stop Plavix and continue aspirin. He was also started on high-dose statins. Permissive hypertension was allowed and his blood pressure came down slowly  Chronic diastolic heart failure: Appears euvolemic no changes made to his medication.  General anxiety: Continue current medications no changes made. Discharge Instructions  Discharge Instructions     Diet - low sodium heart healthy   Complete by: As directed    Increase activity slowly   Complete by:  As directed       Allergies as of 11/05/2021   No Known Allergies      Medication List     TAKE these medications    aspirin EC 81 MG tablet Take 1 tablet (81 mg total) by mouth daily. Swallow whole.   atorvastatin 40 MG tablet Commonly known as: LIPITOR Take 1 tablet (40 mg total) by mouth daily.   citalopram 10 MG tablet Commonly known as: CELEXA Take 10 mg by mouth daily.   clopidogrel 75 MG tablet Commonly known as: PLAVIX Take 1 tablet (75 mg total) by mouth daily for 21 days.   fluticasone 50 MCG/ACT nasal spray Commonly known as: FLONASE Place 2 sprays into the nose daily.   LUBRICATING PLUS EYE DROPS OP Place 1 drop into both eyes 2 (two) times daily as needed (dry eyes).   SENIOR MULTIVITAMIN PLUS PO Take 1 tablet by mouth daily.   UNISOM PO Take 0.5 tablets by mouth at bedtime as needed (sleep).   VITAMIN D-3 PO Take 1 capsule by mouth daily.        No Known Allergies  Consultations: Neurology   Procedures/Studies: ECHOCARDIOGRAM COMPLETE BUBBLE STUDY  Result Date: 11/04/2021    ECHOCARDIOGRAM REPORT   Patient Name:   Keith Bailey Date of Exam: 11/04/2021 Medical Rec #:  [id number]      Height:       73.0 in Accession #:    [id number]     Weight:       166.7 lb Date of Birth:  1951-04-08      BSA:          1.991 m Patient Age:  71 years       BP:           138/82 mmHg Patient Gender: M              HR:           56 bpm. Exam Location:  Inpatient Procedure: 2D Echo, Cardiac Doppler, Color Doppler and Saline Contrast Bubble            Study Indications:    TIA  History:        Patient has no prior history of Echocardiogram examinations.                 Arrythmias:Bradycardia.  Sonographer:    Roosvelt Maser RDCS Referring Phys: [id number] Deno Lunger [facility] IMPRESSIONS  1. Left ventricular ejection fraction, by estimation, is 60 to 65%. Left ventricular ejection fraction by 3D volume is 67 %. The left ventricle has normal function. The left ventricle has  no regional wall motion abnormalities. Left ventricular diastolic  parameters are consistent with Grade I diastolic dysfunction (impaired relaxation).  2. Right ventricular systolic function is normal. The right ventricular size is normal. There is normal pulmonary artery systolic pressure.  3. The mitral valve is myxomatous. Trivial mitral valve regurgitation.  4. The aortic valve is tricuspid. Aortic valve regurgitation is mild. No aortic stenosis is present.  5. Aortic dilatation noted. There is mild dilatation of the aortic root, measuring 44 mm.  6. The inferior vena cava is normal in size with greater than 50% respiratory variability, suggesting right atrial pressure of 3 mmHg.  7. Agitated saline contrast bubble study was negative, with no evidence of any interatrial shunt. FINDINGS  Left Ventricle: Left ventricular ejection fraction, by estimation, is 60 to 65%. Left ventricular ejection fraction by 3D volume is 67 %. The left ventricle has normal function. The left ventricle has no regional wall motion abnormalities. The left ventricular internal cavity size was normal in size. There is no left ventricular hypertrophy. Left ventricular diastolic parameters are consistent with Grade I diastolic dysfunction (impaired relaxation). Normal left ventricular filling pressure. Right Ventricle: The right ventricular size is normal. No increase in right ventricular wall thickness. Right ventricular systolic function is normal. There is normal pulmonary artery systolic pressure. The tricuspid regurgitant velocity is 1.97 m/s, and  with an assumed right atrial pressure of 3 mmHg, the estimated right ventricular systolic pressure is 18.5 mmHg. Left Atrium: Left atrial size was normal in size. Right Atrium: Right atrial size was normal in size. Pericardium: There is no evidence of pericardial effusion. Mitral Valve: The mitral valve is myxomatous. There is mild thickening of the mitral valve leaflet(s). Trivial mitral  valve regurgitation. Tricuspid Valve: The tricuspid valve is normal in structure. Tricuspid valve regurgitation is trivial. Aortic Valve: The aortic valve is tricuspid. Aortic valve regurgitation is mild. No aortic stenosis is present. Aortic valve mean gradient measures 2.0 mmHg. Aortic valve peak gradient measures 4.8 mmHg. Aortic valve area, by VTI measures 2.86 cm. Pulmonic Valve: The pulmonic valve was grossly normal. Pulmonic valve regurgitation is not visualized. Aorta: Aortic dilatation noted. There is mild dilatation of the aortic root, measuring 44 mm. Venous: The inferior vena cava is normal in size with greater than 50% respiratory variability, suggesting right atrial pressure of 3 mmHg. IAS/Shunts: No atrial level shunt detected by color flow Doppler. Agitated saline contrast was given intravenously to evaluate for intracardiac shunting. Agitated saline contrast bubble study was negative, with no evidence of any interatrial  shunt.  LEFT VENTRICLE PLAX 2D LVIDd:         5.30 cm         Diastology LVIDs:         3.30 cm         LV e' medial:    5.77 cm/s LV PW:         0.90 cm         LV E/e' medial:  8.4 LV IVS:        1.00 cm         LV e' lateral:   11.40 cm/s LVOT diam:     2.00 cm         LV E/e' lateral: 4.2 LV SV:         74 LV SV Index:   37 LVOT Area:     3.14 cm        3D Volume EF                                LV 3D EF:    Left                                             ventricul                                             ar                                             ejection                                             fraction                                             by 3D                                             volume is                                             67 %.                                 3D Volume EF:                                3D EF:        67 %  LV EDV:       148 ml                                LV ESV:       49 ml                                 LV SV:        99 ml RIGHT VENTRICLE RV Basal diam:  3.40 cm RV S prime:     10.60 cm/s TAPSE (M-mode): 1.7 cm LEFT ATRIUM             Index        RIGHT ATRIUM           Index LA diam:        3.10 cm 1.56 cm/m   RA Area:     18.70 cm LA Vol (A2C):   73.7 ml 37.01 ml/m  RA Volume:   50.00 ml  25.11 ml/m LA Vol (A4C):   51.6 ml 25.91 ml/m LA Biplane Vol: 63.4 ml 31.84 ml/m  AORTIC VALVE AV Area (Vmax):    3.31 cm AV Area (Vmean):   2.99 cm AV Area (VTI):     2.86 cm AV Vmax:           109.00 cm/s AV Vmean:          71.900 cm/s AV VTI:            0.258 m AV Peak Grad:      4.8 mmHg AV Mean Grad:      2.0 mmHg LVOT Vmax:         115.00 cm/s LVOT Vmean:        68.400 cm/s LVOT VTI:          0.235 m LVOT/AV VTI ratio: 0.91  AORTA Ao Root diam: 4.40 cm Ao Asc diam:  3.70 cm Ao Arch diam: 3.2 cm MITRAL VALVE               TRICUSPID VALVE MV Area (PHT): 2.00 cm    TR Peak grad:   15.5 mmHg MV Decel Time: 380 msec    TR Vmax:        197.00 cm/s MV E velocity: 48.40 cm/s MV A velocity: 61.30 cm/s  SHUNTS MV E/A ratio:  0.79        Systemic VTI:  0.24 m                            Systemic Diam: 2.00 cm Dani Gobble Croitoru MD Electronically signed by Sanda Klein MD Signature Date/Time: 11/04/2021/12:38:15 PM    Final    VAS US CAROTID  Result Date: 11/04/2021 Carotid Arterial Duplex Study Patient Name:  Keith Bailey  Date of Exam:   11/04/2021 Medical Rec #: [id number]       Accession #:    [id number] Date of Birth: 1950/09/22       Patient Gender: M Patient Age:   17 years Exam Location:  [facility] Procedure:      VAS US CAROTID Referring Phys: ERIC LINDZEN --------------------------------------------------------------------------------  Indications:       Visual disturbance. Risk Factors:      None. Comparison Study:  Prior CTA Neck 11-03-2021 Performing Technologist: Darlin Coco RDMS, RVT  Examination Guidelines:  A complete evaluation includes B-mode imaging, spectral Doppler, color  Doppler, and power Doppler as needed of all accessible portions of each vessel. Bilateral testing is considered an integral part of a complete examination. Limited examinations for reoccurring indications may be performed as noted.  Right Carotid Findings: +----------+--------+--------+--------+------------------+--------+           PSV cm/sEDV cm/sStenosisPlaque DescriptionComments +----------+--------+--------+--------+------------------+--------+ CCA Prox  108     12                                         +----------+--------+--------+--------+------------------+--------+ CCA Distal53      11                                         +----------+--------+--------+--------+------------------+--------+ ICA Prox  50      15                                         +----------+--------+--------+--------+------------------+--------+ ICA Mid   68      20                                tortuous +----------+--------+--------+--------+------------------+--------+ ICA Distal68      21                                         +----------+--------+--------+--------+------------------+--------+ ECA       63      9                                          +----------+--------+--------+--------+------------------+--------+ +----------+--------+-------+----------------+-------------------+           PSV cm/sEDV cmsDescribe        Arm Pressure (mmHG) +----------+--------+-------+----------------+-------------------+ Subclavian122            Multiphasic, WNL                    +----------+--------+-------+----------------+-------------------+ +---------+--------+--+--------+-+---------+ VertebralPSV cm/s40EDV cm/s8Antegrade +---------+--------+--+--------+-+---------+  Left Carotid Findings: +----------+--------+--------+--------+------------------+--------+           PSV cm/sEDV cm/sStenosisPlaque DescriptionComments  +----------+--------+--------+--------+------------------+--------+ CCA Prox  109     15                                         +----------+--------+--------+--------+------------------+--------+ CCA Distal63      13                                         +----------+--------+--------+--------+------------------+--------+ ICA Prox  47      14                                         +----------+--------+--------+--------+------------------+--------+ ICA Mid  63      21                                tortuous +----------+--------+--------+--------+------------------+--------+ ICA Distal83      24                                         +----------+--------+--------+--------+------------------+--------+ ECA       88      9                                          +----------+--------+--------+--------+------------------+--------+ +----------+--------+--------+----------------+-------------------+           PSV cm/sEDV cm/sDescribe        Arm Pressure (mmHG) +----------+--------+--------+----------------+-------------------+ [id number]             Multiphasic, WNL                    +----------+--------+--------+----------------+-------------------+ +---------+--------+--+--------+-+---------+ VertebralPSV cm/s41EDV cm/s9Antegrade +---------+--------+--+--------+-+---------+   Summary: Right Carotid: The extracranial vessels were near-normal with only minimal wall                thickening or plaque. Left Carotid: The extracranial vessels were near-normal with only minimal wall               thickening or plaque. Vertebrals:  Bilateral vertebral arteries demonstrate antegrade flow. Subclavians: Normal flow hemodynamics were seen in bilateral subclavian              arteries. *See table(s) above for measurements and observations.     Preliminary    MR BRAIN WO CONTRAST  Result Date: 11/04/2021 CLINICAL DATA:  71 year old male with visual disturbance,  transient right eye vision loss, TIA. EXAM: MRI HEAD WITHOUT CONTRAST TECHNIQUE: Multiplanar, multiecho pulse sequences of the brain and surrounding structures were obtained without intravenous contrast. COMPARISON:  CT head and CTA head and neck yesterday. FINDINGS: Brain: No restricted diffusion to suggest acute infarction. No midline shift, mass effect, evidence of mass lesion, ventriculomegaly, extra-axial collection or acute intracranial hemorrhage. Cervicomedullary junction and pituitary are within normal limits. Mega cisterna magna, normal variant. Disproportionate volume loss of the parietal lobes (series 11, image 16), fairly symmetric. Elsewhere brain volume appears preserved for age. No cortical encephalomalacia or chronic cerebral blood products identified. Pearline Cables and white matter signal is within normal limits for age throughout the brain. Vascular: Major intracranial vascular flow voids are preserved. Skull and upper cervical spine: Negative visible cervical spine. Visualized bone marrow signal is within normal limits. Sinuses/Orbits: Suprasellar cistern and optic chiasm appear unremarkable. Chronic postoperative changes to both globes. Orbits soft tissues otherwise negative. Scattered bilateral paranasal sinus opacification and mucosal thickening with some retention cysts. Other: Mastoids are clear. Visible internal auditory structures appear normal. Negative visible scalp and face. IMPRESSION: 1. No acute intracranial abnormality. 2. Conspicuous biparietal volume loss, nonspecific but a neurodegenerative disorder or dementia could not be excluded. But otherwise normal for age noncontrast MRI appearance of the Brain. 3. Moderate bilateral paranasal sinus inflammation. Electronically Signed   By: Genevie Ann M.D.   On: 11/04/2021 05:39   CT ANGIO HEAD NECK W WO CM  Result Date: 11/03/2021 CLINICAL DATA:  Initial evaluation for neuro deficit, stroke suspected. EXAM: CT  ANGIOGRAPHY HEAD AND NECK TECHNIQUE:  Multidetector CT imaging of the head and neck was performed using the standard protocol during bolus administration of intravenous contrast. Multiplanar CT image reconstructions and MIPs were obtained to evaluate the vascular anatomy. Carotid stenosis measurements (when applicable) are obtained utilizing NASCET criteria, using the distal internal carotid diameter as the denominator. RADIATION DOSE REDUCTION: This exam was performed according to the departmental dose-optimization program which includes automated exposure control, adjustment of the mA and/or kV according to patient size and/or use of iterative reconstruction technique. CONTRAST:  12mL OMNIPAQUE IOHEXOL 350 MG/ML SOLN COMPARISON:  Head CT from earlier the same day. FINDINGS: CTA NECK FINDINGS Aortic arch: Visualized aortic arch normal in caliber. Origin of the great vessels incompletely visualized on this exam. No visible stenosis about the great vessels. Right carotid system: Right common and internal carotid arteries widely patent without stenosis, dissection or occlusion. Left carotid system: Left common and internal carotid arteries widely patent without stenosis, dissection or occlusion. Vertebral arteries: Both vertebral arteries arise from the subclavian arteries. No proximal subclavian artery stenosis. Both vertebral arteries widely patent without stenosis, dissection or occlusion. Skeleton: No discrete or worrisome osseous lesions. Other neck: No other acute soft tissue abnormality within the neck. Left submandibular gland is hypoplastic and/or absent. Chronic right maxillary sinusitis. Upper chest: Visualized upper chest demonstrates no acute finding. Review of the MIP images confirms the above findings CTA HEAD FINDINGS Anterior circulation: Petrous segments patent bilaterally. Atheromatous change within the carotid siphons without hemodynamically significant stenosis. A1 segments patent bilaterally. Normal anterior communicating artery  complex. Anterior cerebral arteries patent without stenosis. No M1 stenosis or occlusion. No proximal MCA branch occlusion. Distal MCA branches perfused and symmetric. Posterior circulation: Both vertebral arteries patent without stenosis. Both PICA patent. Basilar patent to its distal aspect without stenosis. Left PCA primarily supplied via the basilar. Right PCA supplied via a hypoplastic right P1 segment and robust right posterior communicating artery. Both PCAs patent to their distal aspects without stenosis. Venous sinuses: Patent allowing for timing the contrast bolus. Anatomic variants: As above. No aneurysm. No abnormal enhancement on delayed sequence. Review of the MIP images confirms the above findings IMPRESSION: 1. Negative CTA for large vessel occlusion or other emergent finding. 2. Mild atheromatous change about the carotid siphons without hemodynamically significant stenosis. 3. Chronic right maxillary sinusitis. Electronically Signed   By: Rise Mu M.D.   On: 11/03/2021 23:48   CT HEAD WO CONTRAST  Result Date: 11/03/2021 CLINICAL DATA:  Temporary right eye vision loss, resolved EXAM: CT HEAD WITHOUT CONTRAST TECHNIQUE: Contiguous axial images were obtained from the base of the skull through the vertex without intravenous contrast. RADIATION DOSE REDUCTION: This exam was performed according to the departmental dose-optimization program which includes automated exposure control, adjustment of the mA and/or kV according to patient size and/or use of iterative reconstruction technique. COMPARISON:  None Available. FINDINGS: Brain: No evidence of acute infarction, hemorrhage, hydrocephalus, extra-axial collection or mass lesion/mass effect. Vascular: No hyperdense vessel or unexpected calcification. Skull: Normal. Negative for fracture or focal lesion. Sinuses/Orbits: Partial opacification of the bilateral ethmoid sinuses. Other: None. IMPRESSION: Normal head CT. Electronically Signed    By: Charline Bills M.D.   On: 11/03/2021 17:33   (Echo, Carotid, EGD, Colonoscopy, ERCP)    Subjective: No complaints  Discharge Exam: Vitals:   11/05/21 0327 11/05/21 0740  BP: 139/81 135/83  Pulse: 67 (!) 52  Resp: 18 13  Temp: 98.1 F (36.7 C) 98 F (36.7  C)  SpO2: 100% 99%   Vitals:   11/04/21 1924 11/04/21 2304 11/05/21 0327 11/05/21 0740  BP: 127/72 (!) 143/82 139/81 135/83  Pulse: (!) 55 (!) 54 67 (!) 52  Resp: 16 15 18 13   Temp: 98.2 F (36.8 C) 98.4 F (36.9 C) 98.1 F (36.7 C) 98 F (36.7 C)  TempSrc: Oral Oral Oral Oral  SpO2: 96% 99% 100% 99%  Weight:        General: Pt is alert, awake, not in acute distress Cardiovascular: RRR, S1/S2 +, no rubs, no gallops Respiratory: CTA bilaterally, no wheezing, no rhonchi Abdominal: Soft, NT, ND, bowel sounds + Extremities: no edema, no cyanosis    The results of significant diagnostics from this hospitalization (including imaging, microbiology, ancillary and laboratory) are listed below for reference.     Microbiology: No results found for this or any previous visit (from the past 240 hour(s)).   Labs: BNP (last 3 results) No results for input(s): "BNP" in the last 8760 hours. Basic Metabolic Panel: Recent Labs  Lab 11/03/21 1656 11/03/21 1707 11/04/21 0327  NA 139 139 138  K 4.4 4.3 4.1  CL 105 102 105  CO2 28  --  27  GLUCOSE 95 86 101*  BUN 12 15 11   CREATININE 0.84 0.80 0.81  CALCIUM 9.3  --  9.1  MG  --   --  2.2   Liver Function Tests: Recent Labs  Lab 11/03/21 1656 11/04/21 0327  AST 22 17  ALT 15 14  ALKPHOS 30* 28*  BILITOT 1.5* 2.1*  PROT 7.1 6.4*  ALBUMIN 4.3 3.9   No results for input(s): "LIPASE", "AMYLASE" in the last 168 hours. No results for input(s): "AMMONIA" in the last 168 hours. CBC: Recent Labs  Lab 11/03/21 1656 11/03/21 1707 11/04/21 0327  WBC 7.7  --  6.9  NEUTROABS 3.7  --  3.4  HGB 15.4 15.3 15.2  HCT 45.2 45.0 45.3  MCV 97.2  --  96.6  PLT 151   --  136*   Cardiac Enzymes: No results for input(s): "CKTOTAL", "CKMB", "CKMBINDEX", "TROPONINI" in the last 168 hours. BNP: Invalid input(s): "POCBNP" CBG: No results for input(s): "GLUCAP" in the last 168 hours. D-Dimer No results for input(s): "DDIMER" in the last 72 hours. Hgb A1c Recent Labs    11/04/21 0327  HGBA1C 5.2   Lipid Profile Recent Labs    11/04/21 0327  CHOL 149  HDL 68  LDLCALC 76  TRIG 23  CHOLHDL 2.2   Thyroid function studies No results for input(s): "TSH", "T4TOTAL", "T3FREE", "THYROIDAB" in the last 72 hours.  Invalid input(s): "FREET3" Anemia work up No results for input(s): "VITAMINB12", "FOLATE", "FERRITIN", "TIBC", "IRON", "RETICCTPCT" in the last 72 hours. Urinalysis No results found for: "COLORURINE", "APPEARANCEUR", "LABSPEC", "PHURINE", "GLUCOSEU", "HGBUR", "BILIRUBINUR", "KETONESUR", "PROTEINUR", "UROBILINOGEN", "NITRITE", "LEUKOCYTESUR" Sepsis Labs Recent Labs  Lab 11/03/21 1656 11/04/21 0327  WBC 7.7 6.9   Microbiology No results found for this or any previous visit (from the past 240 hour(s)).   Time coordinating discharge: Over 30 minutes  SIGNED:   Charlynne Cousins, MD  [facility] 11/05/2021, 7:52 AM Pager   If 7PM-7AM, please contact night-coverage [email or web address] Password [email or web address]

## 2021-11-13 DIAGNOSIS — K253 Acute gastric ulcer without hemorrhage or perforation: Secondary | ICD-10-CM | POA: Diagnosis not present

## 2021-11-13 DIAGNOSIS — D696 Thrombocytopenia, unspecified: Secondary | ICD-10-CM | POA: Diagnosis not present

## 2021-12-05 DIAGNOSIS — Z6822 Body mass index (BMI) 22.0-22.9, adult: Secondary | ICD-10-CM | POA: Diagnosis not present

## 2021-12-05 DIAGNOSIS — I5189 Other ill-defined heart diseases: Secondary | ICD-10-CM | POA: Diagnosis not present

## 2021-12-05 DIAGNOSIS — H539 Unspecified visual disturbance: Secondary | ICD-10-CM | POA: Diagnosis not present

## 2021-12-15 DIAGNOSIS — H40013 Open angle with borderline findings, low risk, bilateral: Secondary | ICD-10-CM | POA: Diagnosis not present

## 2022-03-19 DIAGNOSIS — I351 Nonrheumatic aortic (valve) insufficiency: Secondary | ICD-10-CM | POA: Diagnosis not present

## 2022-03-19 DIAGNOSIS — Z0001 Encounter for general adult medical examination with abnormal findings: Secondary | ICD-10-CM | POA: Diagnosis not present

## 2022-03-19 DIAGNOSIS — R030 Elevated blood-pressure reading, without diagnosis of hypertension: Secondary | ICD-10-CM | POA: Diagnosis not present

## 2022-03-19 DIAGNOSIS — R69 Illness, unspecified: Secondary | ICD-10-CM | POA: Diagnosis not present

## 2022-03-19 DIAGNOSIS — F17210 Nicotine dependence, cigarettes, uncomplicated: Secondary | ICD-10-CM | POA: Diagnosis not present

## 2022-03-19 DIAGNOSIS — I5189 Other ill-defined heart diseases: Secondary | ICD-10-CM | POA: Diagnosis not present

## 2022-03-19 DIAGNOSIS — R001 Bradycardia, unspecified: Secondary | ICD-10-CM | POA: Diagnosis not present

## 2022-04-24 DIAGNOSIS — N400 Enlarged prostate without lower urinary tract symptoms: Secondary | ICD-10-CM | POA: Diagnosis not present

## 2022-04-24 DIAGNOSIS — Z0001 Encounter for general adult medical examination with abnormal findings: Secondary | ICD-10-CM | POA: Diagnosis not present

## 2022-04-24 DIAGNOSIS — R946 Abnormal results of thyroid function studies: Secondary | ICD-10-CM | POA: Diagnosis not present

## 2022-04-24 DIAGNOSIS — Z13220 Encounter for screening for lipoid disorders: Secondary | ICD-10-CM | POA: Diagnosis not present

## 2022-05-30 DIAGNOSIS — F330 Major depressive disorder, recurrent, mild: Secondary | ICD-10-CM | POA: Diagnosis not present

## 2022-05-30 DIAGNOSIS — R69 Illness, unspecified: Secondary | ICD-10-CM | POA: Diagnosis not present

## 2022-05-30 DIAGNOSIS — R030 Elevated blood-pressure reading, without diagnosis of hypertension: Secondary | ICD-10-CM | POA: Diagnosis not present

## 2022-05-30 DIAGNOSIS — Z6823 Body mass index (BMI) 23.0-23.9, adult: Secondary | ICD-10-CM | POA: Diagnosis not present

## 2022-05-30 DIAGNOSIS — R7989 Other specified abnormal findings of blood chemistry: Secondary | ICD-10-CM | POA: Diagnosis not present

## 2022-05-30 DIAGNOSIS — I77810 Thoracic aortic ectasia: Secondary | ICD-10-CM | POA: Diagnosis not present

## 2022-05-30 DIAGNOSIS — D696 Thrombocytopenia, unspecified: Secondary | ICD-10-CM | POA: Diagnosis not present

## 2022-07-27 DIAGNOSIS — H04123 Dry eye syndrome of bilateral lacrimal glands: Secondary | ICD-10-CM | POA: Diagnosis not present

## 2022-07-27 DIAGNOSIS — H43813 Vitreous degeneration, bilateral: Secondary | ICD-10-CM | POA: Diagnosis not present

## 2022-07-27 DIAGNOSIS — D23122 Other benign neoplasm of skin of left lower eyelid, including canthus: Secondary | ICD-10-CM | POA: Diagnosis not present

## 2022-07-27 DIAGNOSIS — H40013 Open angle with borderline findings, low risk, bilateral: Secondary | ICD-10-CM | POA: Diagnosis not present

## 2022-07-27 DIAGNOSIS — H524 Presbyopia: Secondary | ICD-10-CM | POA: Diagnosis not present

## 2022-07-27 DIAGNOSIS — Z961 Presence of intraocular lens: Secondary | ICD-10-CM | POA: Diagnosis not present

## 2022-08-08 DIAGNOSIS — I77810 Thoracic aortic ectasia: Secondary | ICD-10-CM | POA: Diagnosis not present

## 2022-08-08 DIAGNOSIS — I700 Atherosclerosis of aorta: Secondary | ICD-10-CM | POA: Diagnosis not present

## 2022-08-08 DIAGNOSIS — R0602 Shortness of breath: Secondary | ICD-10-CM | POA: Diagnosis not present

## 2022-11-26 DIAGNOSIS — R946 Abnormal results of thyroid function studies: Secondary | ICD-10-CM | POA: Diagnosis not present

## 2022-11-26 DIAGNOSIS — Z0001 Encounter for general adult medical examination with abnormal findings: Secondary | ICD-10-CM | POA: Diagnosis not present

## 2022-11-26 DIAGNOSIS — Z13220 Encounter for screening for lipoid disorders: Secondary | ICD-10-CM | POA: Diagnosis not present

## 2022-11-26 DIAGNOSIS — Z1329 Encounter for screening for other suspected endocrine disorder: Secondary | ICD-10-CM | POA: Diagnosis not present

## 2022-12-03 DIAGNOSIS — Z0001 Encounter for general adult medical examination with abnormal findings: Secondary | ICD-10-CM | POA: Diagnosis not present

## 2022-12-03 DIAGNOSIS — Z23 Encounter for immunization: Secondary | ICD-10-CM | POA: Diagnosis not present

## 2022-12-03 DIAGNOSIS — I5189 Other ill-defined heart diseases: Secondary | ICD-10-CM | POA: Diagnosis not present

## 2022-12-03 DIAGNOSIS — F330 Major depressive disorder, recurrent, mild: Secondary | ICD-10-CM | POA: Diagnosis not present

## 2022-12-03 DIAGNOSIS — R7989 Other specified abnormal findings of blood chemistry: Secondary | ICD-10-CM | POA: Diagnosis not present

## 2022-12-03 DIAGNOSIS — I77810 Thoracic aortic ectasia: Secondary | ICD-10-CM | POA: Diagnosis not present

## 2022-12-03 DIAGNOSIS — K5790 Diverticulosis of intestine, part unspecified, without perforation or abscess without bleeding: Secondary | ICD-10-CM | POA: Diagnosis not present

## 2022-12-03 DIAGNOSIS — R030 Elevated blood-pressure reading, without diagnosis of hypertension: Secondary | ICD-10-CM | POA: Diagnosis not present

## 2022-12-03 DIAGNOSIS — I700 Atherosclerosis of aorta: Secondary | ICD-10-CM | POA: Diagnosis not present

## 2023-09-04 DIAGNOSIS — H52203 Unspecified astigmatism, bilateral: Secondary | ICD-10-CM | POA: Diagnosis not present

## 2023-09-04 DIAGNOSIS — H40013 Open angle with borderline findings, low risk, bilateral: Secondary | ICD-10-CM | POA: Diagnosis not present

## 2023-09-04 DIAGNOSIS — H04123 Dry eye syndrome of bilateral lacrimal glands: Secondary | ICD-10-CM | POA: Diagnosis not present

## 2023-09-04 DIAGNOSIS — Z961 Presence of intraocular lens: Secondary | ICD-10-CM | POA: Diagnosis not present

## 2023-09-04 DIAGNOSIS — H43813 Vitreous degeneration, bilateral: Secondary | ICD-10-CM | POA: Diagnosis not present

## 2023-09-04 DIAGNOSIS — H524 Presbyopia: Secondary | ICD-10-CM | POA: Diagnosis not present

## 2023-11-08 DIAGNOSIS — Z6822 Body mass index (BMI) 22.0-22.9, adult: Secondary | ICD-10-CM | POA: Diagnosis not present

## 2023-11-08 DIAGNOSIS — M25571 Pain in right ankle and joints of right foot: Secondary | ICD-10-CM | POA: Diagnosis not present

## 2023-11-14 DIAGNOSIS — M25571 Pain in right ankle and joints of right foot: Secondary | ICD-10-CM | POA: Diagnosis not present

## 2023-11-14 DIAGNOSIS — R2241 Localized swelling, mass and lump, right lower limb: Secondary | ICD-10-CM | POA: Diagnosis not present

## 2023-11-14 DIAGNOSIS — Z6822 Body mass index (BMI) 22.0-22.9, adult: Secondary | ICD-10-CM | POA: Diagnosis not present

## 2023-12-12 DIAGNOSIS — Z0000 Encounter for general adult medical examination without abnormal findings: Secondary | ICD-10-CM | POA: Diagnosis not present

## 2023-12-12 DIAGNOSIS — Z6822 Body mass index (BMI) 22.0-22.9, adult: Secondary | ICD-10-CM | POA: Diagnosis not present

## 2023-12-12 DIAGNOSIS — E559 Vitamin D deficiency, unspecified: Secondary | ICD-10-CM | POA: Diagnosis not present

## 2023-12-12 DIAGNOSIS — Z0001 Encounter for general adult medical examination with abnormal findings: Secondary | ICD-10-CM | POA: Diagnosis not present

## 2023-12-12 DIAGNOSIS — Z1331 Encounter for screening for depression: Secondary | ICD-10-CM | POA: Diagnosis not present

## 2023-12-12 DIAGNOSIS — Z1389 Encounter for screening for other disorder: Secondary | ICD-10-CM | POA: Diagnosis not present

## 2023-12-12 DIAGNOSIS — I77810 Thoracic aortic ectasia: Secondary | ICD-10-CM | POA: Diagnosis not present

## 2023-12-12 DIAGNOSIS — D696 Thrombocytopenia, unspecified: Secondary | ICD-10-CM | POA: Diagnosis not present

## 2024-01-21 DIAGNOSIS — I77810 Thoracic aortic ectasia: Secondary | ICD-10-CM | POA: Diagnosis not present

## 2024-01-21 DIAGNOSIS — I700 Atherosclerosis of aorta: Secondary | ICD-10-CM | POA: Diagnosis not present

## 2024-01-21 DIAGNOSIS — Z2989 Encounter for other specified prophylactic measures: Secondary | ICD-10-CM | POA: Diagnosis not present

## 2024-07-03 ENCOUNTER — Ambulatory Visit: Admitting: Diagnostic Neuroimaging
